# Patient Record
Sex: Male | Born: 1945 | Hispanic: No | Marital: Married | Smoking: Current every day smoker
Health system: Southern US, Community
[De-identification: ages and names within clinical notes are randomized; demographics above are authoritative.]

## PROBLEM LIST (undated history)

## (undated) HISTORY — PX: JOINT REPLACEMENT: SHX530

## (undated) HISTORY — PX: KNEE ARTHROPLASTY: SHX992

---

## 2008-06-27 ENCOUNTER — Inpatient Hospital Stay (HOSPITAL_COMMUNITY): Admission: EM | Admit: 2008-06-27 | Discharge: 2008-06-30 | Payer: Self-pay | Admitting: Emergency Medicine

## 2010-07-21 LAB — DIFFERENTIAL
Basophils Relative: 2 % — ABNORMAL HIGH (ref 0–1)
Eosinophils Absolute: 0 10*3/uL (ref 0.0–0.7)
Eosinophils Relative: 0 % (ref 0–5)
Monocytes Absolute: 0.4 10*3/uL (ref 0.1–1.0)
Monocytes Relative: 5 % (ref 3–12)

## 2010-07-21 LAB — CBC
HCT: 43.3 % (ref 39.0–52.0)
Hemoglobin: 14.9 g/dL (ref 13.0–17.0)
MCHC: 34.3 g/dL (ref 30.0–36.0)
MCV: 91.5 fL (ref 78.0–100.0)
RBC: 4.73 MIL/uL (ref 4.22–5.81)

## 2010-07-21 LAB — BASIC METABOLIC PANEL
CO2: 23 mEq/L (ref 19–32)
Chloride: 104 mEq/L (ref 96–112)
GFR calc Af Amer: >60 mL/min (ref >60)
Glucose, Bld: 109 mg/dL — ABNORMAL HIGH (ref 70–99)
Sodium: 137 mEq/L (ref 135–145)

## 2010-07-21 LAB — PROTIME-INR
INR: 1.3 (ref 0.00–1.49)
Prothrombin Time: 14.9 seconds (ref 11.6–15.2)
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
## 2010-08-23 NOTE — Op Note (Signed)
John Padilla, John Padilla                  ACCOUNT NO.:  [id number]   MEDICAL RECORD NO.:  [id number]          PATIENT TYPE:  INP   LOCATION:  1616                         FACILITY:  [facility]   PHYSICIAN:  Burnard Bunting, M.D.    DATE OF BIRTH:  03-Sep-1945   DATE OF PROCEDURE:  06/27/2008  DATE OF DISCHARGE:                               OPERATIVE REPORT   PREOPERATIVE DIAGNOSIS:  Right distal femur fracture.   POSTOPERATIVE DIAGNOSIS:  Right distal femur fracture.   OPERATION/PROCEDURE:  Right distal femur fracture, retrograde  intramedullary nail inserted.   SURGEON:  Burnard Bunting, M.D.   ASSISTANT:  None.   ANESTHESIA:  Spinal.   INDICATIONS:  Montey Ebel is an ambulatory, 65 year old patient who fell  on his distal right femur and is sent for operative management of the  distal femur fracture after explanation of the risks and benefits.   COMPONENTS:  [employer] 10 x 36 retrograde femoral nail with  distal screws, 5 x 75, 60 and 45 with a 50 x 30 proximal AP screw x1.   DESCRIPTION OF PROCEDURE:  The patient was brought to the operating room  where spinal anesthesia was induced.  IV antibiotics were given  preoperatively.  Time out was called. The patient was placed on the  fracture table with a bump under the right hip and right leg, prepped  with DuraPrep solution and draped in a sterile manner.  Collier Flowers was used  to cover the operative field.  Incision was made from the inferior  corner of the patella through the patella tendon.  Skin and subcutaneous  tissue were sharply divided.  Patella tendon was divided.  At this point  the femur was encountered and a guide pin was placed in the AP and  lateral planes in the central portion of the femur, across the fracture  site. At this time proximal reaming was performed with tissue protector  in place.  Longer guide pin was then placed across the fracture and the  good cortical chatter was achieved with an 11 mm flexible reamer.  A  10  x 36 nail was then placed across the fracture site. Three distal  interlocking screws were placed through separate incisions with  excellent purchase obtained and correct placement and length were  confirmed in the orthogonal planes under fluoroscopy.  A single AP screw  was ;placed in the distal hole of the proximal interlock with good  purchase obtained and good length confirming the AP and lateral planes.  At this time the incision was  irrigated and closed using 0 Vicryl, 2-0 Vicryl sutures and skin  staples.  The other interlock incisions were also irrigated and closed  using 2-0 Vicryl and skin staples.  Bulky dressing was applied.  The  patient tolerated the procedure well without any immediate  complications.      Burnard Bunting, M.D.  Electronically Signed     GSD/MEDQ  D:  06/27/2008  T:  06/28/2008  Job:  [id number]
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
## 2013-02-14 ENCOUNTER — Ambulatory Visit: Payer: Self-pay | Admitting: Family

## 2013-03-21 ENCOUNTER — Ambulatory Visit: Payer: Self-pay | Admitting: Family

## 2019-11-01 ENCOUNTER — Ambulatory Visit: Payer: Self-pay | Attending: Critical Care Medicine

## 2019-11-01 DIAGNOSIS — Z23 Encounter for immunization: Secondary | ICD-10-CM

## 2019-11-01 NOTE — Progress Notes (Signed)
   Covid-19 Vaccination Clinic  Name:  John Padilla    MRN: [id number] DOB: [birth year]  11/01/2019  [name] was observed post Covid-19 immunization for 15 minutes without incident. He was provided with Vaccine Information Sheet and instruction to access the V-Safe system.   [name] was instructed to call 911 with any severe reactions post vaccine: Marland Kitchen Difficulty breathing  . Swelling of face and throat  . A fast heartbeat  . A bad rash all over body  . Dizziness and weakness   Immunizations Administered    Name Date Dose VIS Date Route   Pfizer COVID-19 Vaccine 11/01/2019 10:55 AM 0.3 mL 06/04/2018 Intramuscular   Manufacturer: [employer], [employer]   Lot: [id number]   [place]: [id number]

## 2019-11-22 ENCOUNTER — Ambulatory Visit: Payer: Self-pay

## 2021-09-21 ENCOUNTER — Encounter (HOSPITAL_COMMUNITY): Payer: Self-pay

## 2021-09-21 ENCOUNTER — Emergency Department (HOSPITAL_COMMUNITY): Payer: Medicare Other

## 2021-09-21 ENCOUNTER — Inpatient Hospital Stay (HOSPITAL_COMMUNITY)
Admission: EM | Admit: 2021-09-21 | Discharge: 2021-09-27 | DRG: 956 | Disposition: A | Discharge status: Skilled Nursing Facility | Payer: Medicare Other | Attending: Internal Medicine | Admitting: Internal Medicine

## 2021-09-21 DIAGNOSIS — K760 Fatty (change of) liver, not elsewhere classified: Secondary | ICD-10-CM

## 2021-09-21 DIAGNOSIS — R066 Hiccough: Secondary | ICD-10-CM | POA: Diagnosis not present

## 2021-09-21 DIAGNOSIS — R748 Abnormal levels of other serum enzymes: Secondary | ICD-10-CM

## 2021-09-21 DIAGNOSIS — B182 Chronic viral hepatitis C: Secondary | ICD-10-CM | POA: Diagnosis present

## 2021-09-21 DIAGNOSIS — Z789 Other specified health status: Secondary | ICD-10-CM

## 2021-09-21 DIAGNOSIS — S72141A Displaced intertrochanteric fracture of right femur, initial encounter for closed fracture: Principal | ICD-10-CM

## 2021-09-21 DIAGNOSIS — M6282 Rhabdomyolysis: Secondary | ICD-10-CM

## 2021-09-21 DIAGNOSIS — D689 Coagulation defect, unspecified: Secondary | ICD-10-CM

## 2021-09-21 DIAGNOSIS — Z23 Encounter for immunization: Secondary | ICD-10-CM

## 2021-09-21 DIAGNOSIS — F17210 Nicotine dependence, cigarettes, uncomplicated: Secondary | ICD-10-CM | POA: Diagnosis present

## 2021-09-21 DIAGNOSIS — G929 Unspecified toxic encephalopathy: Secondary | ICD-10-CM

## 2021-09-21 DIAGNOSIS — Z6821 Body mass index (BMI) 21.0-21.9, adult: Secondary | ICD-10-CM

## 2021-09-21 DIAGNOSIS — D696 Thrombocytopenia, unspecified: Secondary | ICD-10-CM

## 2021-09-21 DIAGNOSIS — E871 Hypo-osmolality and hyponatremia: Secondary | ICD-10-CM

## 2021-09-21 DIAGNOSIS — F1010 Alcohol abuse, uncomplicated: Secondary | ICD-10-CM | POA: Diagnosis present

## 2021-09-21 DIAGNOSIS — R160 Hepatomegaly, not elsewhere classified: Secondary | ICD-10-CM

## 2021-09-21 DIAGNOSIS — T796XXA Traumatic ischemia of muscle, initial encounter: Secondary | ICD-10-CM | POA: Diagnosis present

## 2021-09-21 DIAGNOSIS — T424X5A Adverse effect of benzodiazepines, initial encounter: Secondary | ICD-10-CM | POA: Diagnosis not present

## 2021-09-21 DIAGNOSIS — L80 Vitiligo: Secondary | ICD-10-CM

## 2021-09-21 DIAGNOSIS — D649 Anemia, unspecified: Secondary | ICD-10-CM

## 2021-09-21 DIAGNOSIS — D6959 Other secondary thrombocytopenia: Secondary | ICD-10-CM | POA: Diagnosis present

## 2021-09-21 DIAGNOSIS — D638 Anemia in other chronic diseases classified elsewhere: Secondary | ICD-10-CM | POA: Diagnosis present

## 2021-09-21 DIAGNOSIS — E440 Moderate protein-calorie malnutrition: Secondary | ICD-10-CM | POA: Diagnosis present

## 2021-09-21 DIAGNOSIS — W19XXXA Unspecified fall, initial encounter: Secondary | ICD-10-CM | POA: Diagnosis present

## 2021-09-21 DIAGNOSIS — Z20822 Contact with and (suspected) exposure to covid-19: Secondary | ICD-10-CM | POA: Diagnosis present

## 2021-09-21 DIAGNOSIS — F1090 Alcohol use, unspecified, uncomplicated: Secondary | ICD-10-CM

## 2021-09-21 DIAGNOSIS — G928 Other toxic encephalopathy: Secondary | ICD-10-CM | POA: Diagnosis not present

## 2021-09-21 DIAGNOSIS — D62 Acute posthemorrhagic anemia: Secondary | ICD-10-CM

## 2021-09-21 DIAGNOSIS — Y9355 Activity, bike riding: Secondary | ICD-10-CM

## 2021-09-21 LAB — BASIC METABOLIC PANEL
Anion gap: 13 (ref 5–15)
BUN: 14 mg/dL (ref 8–23)
CO2: 19 mmol/L — ABNORMAL LOW (ref 22–32)
Calcium: 8.7 mg/dL — ABNORMAL LOW (ref 8.9–10.3)
Chloride: 105 mmol/L (ref 98–111)
Creatinine, Ser: 1.2 mg/dL (ref 0.61–1.24)
GFR, Estimated: >60 mL/min (ref >60)
Glucose, Bld: 106 mg/dL — ABNORMAL HIGH (ref 70–99)
Potassium: 4 mmol/L (ref 3.5–5.1)
Sodium: 137 mmol/L (ref 135–145)

## 2021-09-21 MED ORDER — SODIUM CHLORIDE 0.9 % IV BOLUS (SEPSIS)
1000.0000 mL | Freq: Once | INTRAVENOUS | Status: AC
  Administered 2021-09-22: 1000 mL via INTRAVENOUS

## 2021-09-21 MED ORDER — FENTANYL CITRATE PF 50 MCG/ML IJ SOSY
50.0000 ug | PREFILLED_SYRINGE | Freq: Once | INTRAMUSCULAR | Status: AC
  Administered 2021-09-22: 50 ug via INTRAVENOUS
  Filled 2021-09-21: qty 1

## 2021-09-21 MED ORDER — TETANUS-DIPHTH-ACELL PERTUSSIS 5-2.5-18.5 LF-MCG/0.5 IM SUSY
0.5000 mL | PREFILLED_SYRINGE | Freq: Once | INTRAMUSCULAR | Status: AC
Start: 2021-09-22 — End: 2021-09-22
  Administered 2021-09-22: 0.5 mL via INTRAMUSCULAR
  Filled 2021-09-21: qty 0.5

## 2021-09-21 NOTE — Progress Notes (Signed)
R intertroch fracture above retrograde femoral nail. Nail removal and ORIF indicated. Please admit to medicine for optimization. Possible OR tomorrow with Dr. Doreatha Martin pending medical clearance and OR availability. Full consult note to follow in the AM.

## 2021-09-21 NOTE — ED Provider Notes (Incomplete)
  [facility] EMERGENCY DEPARTMENT Provider Note   CSN: [id number] Arrival date & time: 09/21/21  2235     History {Add pertinent medical, surgical, social history, OB history to HPI:1} Chief Complaint  Patient presents with  . Fall    John Padilla is a 76 y.o. male.   Fall       Home Medications Prior to Admission medications   Not on File      Allergies    Patient has no known allergies.    Review of Systems   Review of Systems  Physical Exam Updated Vital Signs BP 135/62   Pulse 83   Temp 97.9 F (36.6 C) (Oral)   Resp 18   SpO2 95%  Physical Exam  ED Results / Procedures / Treatments   Labs (all labs ordered are listed, but only abnormal results are displayed) Labs Reviewed  CBC  BASIC METABOLIC PANEL    EKG None  Radiology DG Hip Unilat  With Pelvis 2-3 Views Right  Result Date: 09/21/2021 CLINICAL DATA:  Fall EXAM: DG HIP (WITH OR WITHOUT PELVIS) 2-3V RIGHT COMPARISON:  06/27/2008 FINDINGS: Right femoral intramedullary rod in place. There is a right femoral intertrochanteric fracture just above the intramedullary nail. Varus angulation. No subluxation or dislocation. Hip joints and SI joints symmetric. IMPRESSION: Right femoral intertrochanteric fracture with varus angulation. Fracture is just above the indwelling intramedullary nail. Electronically Signed   By: Rolm Baptise M.D.   On: 09/21/2021 23:06    Procedures Procedures  {Document cardiac monitor, telemetry assessment procedure when appropriate:1}  Medications Ordered in ED Medications - No data to display  ED Course/ Medical Decision Making/ A&P                           Medical Decision Making Amount and/or Complexity of Data Reviewed Radiology: ordered.   ***  {Document critical care time when appropriate:1} {Document review of labs and clinical decision tools ie heart score, Chads2Vasc2 etc:1}  {Document your independent review of radiology images, and any  outside records:1} {Document your discussion with family members, caretakers, and with consultants:1} {Document social determinants of health affecting pt's care:1} {Document your decision making why or why not admission, treatments were needed:1} Final Clinical Impression(s) / ED Diagnoses Final diagnoses:  None    Rx / DC Orders ED Discharge Orders     None
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
## 2021-09-22 ENCOUNTER — Encounter (HOSPITAL_COMMUNITY): Payer: Self-pay | Admitting: Internal Medicine

## 2021-09-22 ENCOUNTER — Inpatient Hospital Stay (HOSPITAL_COMMUNITY): Payer: Medicare Other | Admitting: Anesthesiology

## 2021-09-22 ENCOUNTER — Encounter (HOSPITAL_COMMUNITY)
Admission: EM | Disposition: A | Discharge status: Skilled Nursing Facility | Payer: Self-pay | Source: Home / Self Care | Attending: Student

## 2021-09-22 ENCOUNTER — Inpatient Hospital Stay (HOSPITAL_COMMUNITY): Payer: Medicare Other

## 2021-09-22 ENCOUNTER — Other Ambulatory Visit: Payer: Self-pay

## 2021-09-22 ENCOUNTER — Emergency Department (HOSPITAL_COMMUNITY): Payer: Medicare Other

## 2021-09-22 DIAGNOSIS — M6282 Rhabdomyolysis: Secondary | ICD-10-CM

## 2021-09-22 DIAGNOSIS — Z20822 Contact with and (suspected) exposure to covid-19: Secondary | ICD-10-CM | POA: Diagnosis present

## 2021-09-22 DIAGNOSIS — E871 Hypo-osmolality and hyponatremia: Secondary | ICD-10-CM

## 2021-09-22 DIAGNOSIS — Y9355 Activity, bike riding: Secondary | ICD-10-CM | POA: Diagnosis not present

## 2021-09-22 DIAGNOSIS — D689 Coagulation defect, unspecified: Secondary | ICD-10-CM

## 2021-09-22 DIAGNOSIS — Z23 Encounter for immunization: Secondary | ICD-10-CM | POA: Diagnosis present

## 2021-09-22 DIAGNOSIS — G928 Other toxic encephalopathy: Secondary | ICD-10-CM | POA: Diagnosis not present

## 2021-09-22 DIAGNOSIS — T796XXA Traumatic ischemia of muscle, initial encounter: Secondary | ICD-10-CM | POA: Diagnosis present

## 2021-09-22 DIAGNOSIS — B182 Chronic viral hepatitis C: Secondary | ICD-10-CM | POA: Diagnosis present

## 2021-09-22 DIAGNOSIS — R748 Abnormal levels of other serum enzymes: Secondary | ICD-10-CM

## 2021-09-22 DIAGNOSIS — D638 Anemia in other chronic diseases classified elsewhere: Secondary | ICD-10-CM | POA: Diagnosis present

## 2021-09-22 DIAGNOSIS — L80 Vitiligo: Secondary | ICD-10-CM

## 2021-09-22 DIAGNOSIS — S72141A Displaced intertrochanteric fracture of right femur, initial encounter for closed fracture: Secondary | ICD-10-CM

## 2021-09-22 DIAGNOSIS — K760 Fatty (change of) liver, not elsewhere classified: Secondary | ICD-10-CM

## 2021-09-22 DIAGNOSIS — W19XXXA Unspecified fall, initial encounter: Secondary | ICD-10-CM | POA: Diagnosis present

## 2021-09-22 DIAGNOSIS — D6959 Other secondary thrombocytopenia: Secondary | ICD-10-CM | POA: Diagnosis present

## 2021-09-22 DIAGNOSIS — R066 Hiccough: Secondary | ICD-10-CM | POA: Diagnosis not present

## 2021-09-22 DIAGNOSIS — D62 Acute posthemorrhagic anemia: Secondary | ICD-10-CM | POA: Diagnosis not present

## 2021-09-22 DIAGNOSIS — T424X5A Adverse effect of benzodiazepines, initial encounter: Secondary | ICD-10-CM | POA: Diagnosis not present

## 2021-09-22 DIAGNOSIS — Z6821 Body mass index (BMI) 21.0-21.9, adult: Secondary | ICD-10-CM | POA: Diagnosis not present

## 2021-09-22 DIAGNOSIS — D759 Disease of blood and blood-forming organs, unspecified: Secondary | ICD-10-CM

## 2021-09-22 DIAGNOSIS — D649 Anemia, unspecified: Secondary | ICD-10-CM

## 2021-09-22 DIAGNOSIS — F1010 Alcohol abuse, uncomplicated: Secondary | ICD-10-CM | POA: Diagnosis present

## 2021-09-22 DIAGNOSIS — D696 Thrombocytopenia, unspecified: Secondary | ICD-10-CM | POA: Diagnosis present

## 2021-09-22 DIAGNOSIS — R160 Hepatomegaly, not elsewhere classified: Secondary | ICD-10-CM

## 2021-09-22 DIAGNOSIS — Z789 Other specified health status: Secondary | ICD-10-CM

## 2021-09-22 DIAGNOSIS — M9702XA Periprosthetic fracture around internal prosthetic left hip joint, initial encounter: Secondary | ICD-10-CM

## 2021-09-22 DIAGNOSIS — E440 Moderate protein-calorie malnutrition: Secondary | ICD-10-CM | POA: Diagnosis present

## 2021-09-22 DIAGNOSIS — F17210 Nicotine dependence, cigarettes, uncomplicated: Secondary | ICD-10-CM | POA: Diagnosis present

## 2021-09-22 HISTORY — PX: HARDWARE REMOVAL: SHX979

## 2021-09-22 HISTORY — PX: INTRAMEDULLARY (IM) NAIL INTERTROCHANTERIC: SHX5875

## 2021-09-22 LAB — CBC
HCT: 36 % — ABNORMAL LOW (ref 39.0–52.0)
Hemoglobin: 12.7 g/dL — ABNORMAL LOW (ref 13.0–17.0)
MCH: 33.1 pg (ref 26.0–34.0)
MCHC: 35.3 g/dL (ref 30.0–36.0)
MCV: 93.8 fL (ref 80.0–100.0)
Platelets: 48 10*3/uL — ABNORMAL LOW (ref 150–400)
RBC: 3.84 MIL/uL — ABNORMAL LOW (ref 4.22–5.81)
RDW: 13.9 % (ref 11.5–15.5)
WBC: 5.7 10*3/uL (ref 4.0–10.5)
nRBC: 0 % (ref 0.0–0.2)

## 2021-09-22 LAB — CBC WITH DIFFERENTIAL/PLATELET
Abs Immature Granulocytes: 0.02 10*3/uL (ref 0.00–0.07)
Basophils Absolute: 0 10*3/uL (ref 0.0–0.1)
Basophils Relative: 0 %
Eosinophils Absolute: 0 10*3/uL (ref 0.0–0.5)
Eosinophils Relative: 0 %
HCT: 31.5 % — ABNORMAL LOW (ref 39.0–52.0)
Hemoglobin: 10.9 g/dL — ABNORMAL LOW (ref 13.0–17.0)
Immature Granulocytes: 1 %
Lymphocytes Relative: 29 %
Lymphs Abs: 1.3 10*3/uL (ref 0.7–4.0)
MCH: 33.1 pg (ref 26.0–34.0)
MCHC: 34.6 g/dL (ref 30.0–36.0)
MCV: 95.7 fL (ref 80.0–100.0)
Monocytes Absolute: 0.5 10*3/uL (ref 0.1–1.0)
Monocytes Relative: 11 %
Neutro Abs: 2.6 10*3/uL (ref 1.7–7.7)
Neutrophils Relative %: 59 %
Platelets: 44 10*3/uL — ABNORMAL LOW (ref 150–400)
RBC: 3.29 MIL/uL — ABNORMAL LOW (ref 4.22–5.81)
RDW: 13.9 % (ref 11.5–15.5)
WBC: 4.3 10*3/uL (ref 4.0–10.5)
nRBC: 0 % (ref 0.0–0.2)

## 2021-09-22 LAB — MAGNESIUM: Magnesium: 1.7 mg/dL (ref 1.7–2.4)

## 2021-09-22 LAB — PROTIME-INR
INR: 1.5 — ABNORMAL HIGH (ref 0.8–1.2)
INR: 1.5 — ABNORMAL HIGH (ref 0.8–1.2)
Prothrombin Time: 17.9 seconds — ABNORMAL HIGH (ref 11.4–15.2)
Prothrombin Time: 18.1 seconds — ABNORMAL HIGH (ref 11.4–15.2)

## 2021-09-22 LAB — HEPATIC FUNCTION PANEL
ALT: 77 U/L — ABNORMAL HIGH (ref 0–44)
AST: 92 U/L — ABNORMAL HIGH (ref 15–41)
Albumin: 2.8 g/dL — ABNORMAL LOW (ref 3.5–5.0)
Alkaline Phosphatase: 98 U/L (ref 38–126)
Bilirubin, Direct: 0.7 mg/dL — ABNORMAL HIGH (ref 0.0–0.2)
Indirect Bilirubin: 1.1 mg/dL — ABNORMAL HIGH (ref 0.3–0.9)
Total Bilirubin: 1.8 mg/dL — ABNORMAL HIGH (ref 0.3–1.2)
Total Protein: 6.8 g/dL (ref 6.5–8.1)

## 2021-09-22 LAB — COMPREHENSIVE METABOLIC PANEL
ALT: 69 U/L — ABNORMAL HIGH (ref 0–44)
AST: 74 U/L — ABNORMAL HIGH (ref 15–41)
Albumin: 2.4 g/dL — ABNORMAL LOW (ref 3.5–5.0)
Alkaline Phosphatase: 81 U/L (ref 38–126)
Anion gap: 6 (ref 5–15)
BUN: 12 mg/dL (ref 8–23)
CO2: 19 mmol/L — ABNORMAL LOW (ref 22–32)
Calcium: 7.8 mg/dL — ABNORMAL LOW (ref 8.9–10.3)
Chloride: 108 mmol/L (ref 98–111)
Creatinine, Ser: 1.05 mg/dL (ref 0.61–1.24)
GFR, Estimated: >60 mL/min (ref >60)
Glucose, Bld: 127 mg/dL — ABNORMAL HIGH (ref 70–99)
Potassium: 3.9 mmol/L (ref 3.5–5.1)
Sodium: 133 mmol/L — ABNORMAL LOW (ref 135–145)
Total Bilirubin: 1.8 mg/dL — ABNORMAL HIGH (ref 0.3–1.2)
Total Protein: 6.1 g/dL — ABNORMAL LOW (ref 6.5–8.1)

## 2021-09-22 LAB — HEPATITIS PANEL, ACUTE
HCV Ab: REACTIVE — AB
Hep A IgM: NONREACTIVE
Hep B C IgM: NONREACTIVE
Hepatitis B Surface Ag: NONREACTIVE

## 2021-09-22 LAB — SARS CORONAVIRUS 2 BY RT PCR: SARS Coronavirus 2 by RT PCR: NEGATIVE

## 2021-09-22 LAB — CK: Total CK: 803 U/L — ABNORMAL HIGH (ref 49–397)

## 2021-09-22 LAB — SURGICAL PCR SCREEN
MRSA, PCR: NEGATIVE
Staphylococcus aureus: NEGATIVE

## 2021-09-22 LAB — ABO/RH: ABO/RH(D): O POS

## 2021-09-22 LAB — PHOSPHORUS: Phosphorus: 3.3 mg/dL (ref 2.5–4.6)

## 2021-09-22 SURGERY — FIXATION, FRACTURE, INTERTROCHANTERIC, WITH INTRAMEDULLARY ROD
Anesthesia: General | Laterality: Right

## 2021-09-22 MED ORDER — FOLIC ACID 1 MG PO TABS
1.0000 mg | ORAL_TABLET | Freq: Every day | ORAL | Status: DC
  Administered 2021-09-23 – 2021-09-27 (×4): 1 mg via ORAL
  Filled 2021-09-22 (×4): qty 1

## 2021-09-22 MED ORDER — DOCUSATE SODIUM 100 MG PO CAPS
100.0000 mg | ORAL_CAPSULE | Freq: Two times a day (BID) | ORAL | Status: DC
  Administered 2021-09-22 – 2021-09-27 (×7): 100 mg via ORAL
  Filled 2021-09-22 (×8): qty 1

## 2021-09-22 MED ORDER — TRANEXAMIC ACID-NACL 1000-0.7 MG/100ML-% IV SOLN
1000.0000 mg | Freq: Once | INTRAVENOUS | Status: AC
  Administered 2021-09-22: 1000 mg via INTRAVENOUS
  Filled 2021-09-22: qty 100

## 2021-09-22 MED ORDER — ACETAMINOPHEN 325 MG PO TABS: 650.0000 mg | ORAL_TABLET | Freq: Four times a day (QID) | ORAL | Status: DC | PRN

## 2021-09-22 MED ORDER — LACTATED RINGERS IV SOLN: INTRAVENOUS | Status: DC

## 2021-09-22 MED ORDER — METOCLOPRAMIDE HCL 5 MG PO TABS: 5.0000 mg | ORAL_TABLET | Freq: Three times a day (TID) | ORAL | Status: DC | PRN

## 2021-09-22 MED ORDER — THIAMINE HCL 100 MG PO TABS
100.0000 mg | ORAL_TABLET | Freq: Every day | ORAL | Status: DC
  Administered 2021-09-23 – 2021-09-27 (×4): 100 mg via ORAL
  Filled 2021-09-22 (×4): qty 1

## 2021-09-22 MED ORDER — CHLORHEXIDINE GLUCONATE 0.12 % MT SOLN
OROMUCOSAL | Status: AC
  Administered 2021-09-22: 15 mL via OROMUCOSAL
  Filled 2021-09-22: qty 15

## 2021-09-22 MED ORDER — FENTANYL CITRATE (PF) 250 MCG/5ML IJ SOLN
INTRAMUSCULAR | Status: DC | PRN
  Administered 2021-09-22: 100 ug via INTRAVENOUS
  Administered 2021-09-22: 50 ug via INTRAVENOUS

## 2021-09-22 MED ORDER — THIAMINE HCL 100 MG/ML IJ SOLN
100.0000 mg | Freq: Every day | INTRAMUSCULAR | Status: DC
  Administered 2021-09-24: 100 mg via INTRAVENOUS
  Filled 2021-09-22: qty 2

## 2021-09-22 MED ORDER — FENTANYL CITRATE (PF) 100 MCG/2ML IJ SOLN
INTRAMUSCULAR | Status: AC
  Administered 2021-09-22: 100 ug via INTRAVENOUS
  Filled 2021-09-22: qty 2

## 2021-09-22 MED ORDER — OXYCODONE HCL 5 MG PO TABS: 5.0000 mg | ORAL_TABLET | ORAL | Status: DC | PRN

## 2021-09-22 MED ORDER — MIDAZOLAM HCL 2 MG/2ML IJ SOLN
INTRAMUSCULAR | Status: AC
  Filled 2021-09-22: qty 2

## 2021-09-22 MED ORDER — DEXAMETHASONE SODIUM PHOSPHATE 10 MG/ML IJ SOLN
INTRAMUSCULAR | Status: AC
  Filled 2021-09-22: qty 1

## 2021-09-22 MED ORDER — MORPHINE SULFATE (PF) 2 MG/ML IV SOLN: 0.5000 mg | INTRAVENOUS | Status: DC | PRN

## 2021-09-22 MED ORDER — ONDANSETRON HCL 4 MG PO TABS: 4.0000 mg | ORAL_TABLET | Freq: Four times a day (QID) | ORAL | Status: DC | PRN

## 2021-09-22 MED ORDER — ADULT MULTIVITAMIN W/MINERALS CH
1.0000 | ORAL_TABLET | Freq: Every day | ORAL | Status: DC
  Administered 2021-09-23 – 2021-09-27 (×4): 1 via ORAL
  Filled 2021-09-22 (×4): qty 1

## 2021-09-22 MED ORDER — FENTANYL CITRATE (PF) 250 MCG/5ML IJ SOLN
INTRAMUSCULAR | Status: AC
  Filled 2021-09-22: qty 5

## 2021-09-22 MED ORDER — CEFAZOLIN SODIUM-DEXTROSE 2-4 GM/100ML-% IV SOLN
2.0000 g | Freq: Three times a day (TID) | INTRAVENOUS | Status: AC
  Administered 2021-09-22 – 2021-09-23 (×3): 2 g via INTRAVENOUS
  Filled 2021-09-22 (×3): qty 100

## 2021-09-22 MED ORDER — METHOCARBAMOL 500 MG PO TABS
500.0000 mg | ORAL_TABLET | Freq: Four times a day (QID) | ORAL | Status: DC | PRN
  Administered 2021-09-22 – 2021-09-23 (×3): 500 mg via ORAL
  Filled 2021-09-22 (×3): qty 1

## 2021-09-22 MED ORDER — 0.9 % SODIUM CHLORIDE (POUR BTL) OPTIME
TOPICAL | Status: DC | PRN
  Administered 2021-09-22: 1000 mL

## 2021-09-22 MED ORDER — BUPIVACAINE-EPINEPHRINE (PF) 0.5% -1:200000 IJ SOLN
INTRAMUSCULAR | Status: DC | PRN
  Administered 2021-09-22: 25 mL

## 2021-09-22 MED ORDER — VANCOMYCIN HCL 1000 MG IV SOLR
INTRAVENOUS | Status: AC
Start: 2021-09-22 — End: ?
  Filled 2021-09-22: qty 20

## 2021-09-22 MED ORDER — SUGAMMADEX SODIUM 200 MG/2ML IV SOLN
INTRAVENOUS | Status: DC | PRN
  Administered 2021-09-22: 200 mg via INTRAVENOUS

## 2021-09-22 MED ORDER — LORAZEPAM 1 MG PO TABS: 1.0000 mg | ORAL_TABLET | ORAL | Status: DC | PRN

## 2021-09-22 MED ORDER — OXYCODONE HCL 5 MG PO TABS: 5.0000 mg | ORAL_TABLET | Freq: Once | ORAL | Status: DC | PRN

## 2021-09-22 MED ORDER — METHOCARBAMOL 1000 MG/10ML IJ SOLN: 500.0000 mg | Freq: Four times a day (QID) | INTRAVENOUS | Status: DC | PRN

## 2021-09-22 MED ORDER — ONDANSETRON HCL 4 MG/2ML IJ SOLN
INTRAMUSCULAR | Status: AC
  Filled 2021-09-22: qty 2

## 2021-09-22 MED ORDER — ONDANSETRON HCL 4 MG/2ML IJ SOLN
INTRAMUSCULAR | Status: DC | PRN
  Administered 2021-09-22: 4 mg via INTRAVENOUS

## 2021-09-22 MED ORDER — POLYETHYLENE GLYCOL 3350 17 G PO PACK: 17.0000 g | PACK | Freq: Every day | ORAL | Status: DC | PRN

## 2021-09-22 MED ORDER — MIDAZOLAM HCL 2 MG/2ML IJ SOLN
INTRAMUSCULAR | Status: DC | PRN
  Administered 2021-09-22: 1 mg via INTRAVENOUS

## 2021-09-22 MED ORDER — ORAL CARE MOUTH RINSE: 15.0000 mL | Freq: Once | OROMUCOSAL | Status: AC

## 2021-09-22 MED ORDER — AMISULPRIDE (ANTIEMETIC) 5 MG/2ML IV SOLN: 10.0000 mg | Freq: Once | INTRAVENOUS | Status: DC | PRN

## 2021-09-22 MED ORDER — HYDROMORPHONE HCL 1 MG/ML IJ SOLN
0.5000 mg | INTRAMUSCULAR | Status: DC | PRN
  Administered 2021-09-22: 0.5 mg via INTRAVENOUS
  Filled 2021-09-22: qty 1

## 2021-09-22 MED ORDER — HYDROMORPHONE HCL 1 MG/ML IJ SOLN: 0.2500 mg | INTRAMUSCULAR | Status: DC | PRN

## 2021-09-22 MED ORDER — CHLORHEXIDINE GLUCONATE 0.12 % MT SOLN: 15.0000 mL | Freq: Once | OROMUCOSAL | Status: AC

## 2021-09-22 MED ORDER — DEXAMETHASONE SODIUM PHOSPHATE 10 MG/ML IJ SOLN
INTRAMUSCULAR | Status: DC | PRN
  Administered 2021-09-22: 10 mg via INTRAVENOUS

## 2021-09-22 MED ORDER — PHENYLEPHRINE HCL-NACL 20-0.9 MG/250ML-% IV SOLN
INTRAVENOUS | Status: DC | PRN
  Administered 2021-09-22: 40 ug/min via INTRAVENOUS

## 2021-09-22 MED ORDER — SODIUM CHLORIDE 0.9 % IV SOLN: INTRAVENOUS | Status: DC | PRN

## 2021-09-22 MED ORDER — ROCURONIUM BROMIDE 10 MG/ML (PF) SYRINGE
PREFILLED_SYRINGE | INTRAVENOUS | Status: DC | PRN
  Administered 2021-09-22: 10 mg via INTRAVENOUS
  Administered 2021-09-22: 50 mg via INTRAVENOUS

## 2021-09-22 MED ORDER — OXYCODONE HCL 5 MG/5ML PO SOLN: 5.0000 mg | Freq: Once | ORAL | Status: DC | PRN

## 2021-09-22 MED ORDER — CEFAZOLIN SODIUM-DEXTROSE 2-3 GM-%(50ML) IV SOLR
INTRAVENOUS | Status: DC | PRN
  Administered 2021-09-22: 2 g via INTRAVENOUS

## 2021-09-22 MED ORDER — MELATONIN 5 MG PO TABS
5.0000 mg | ORAL_TABLET | Freq: Every evening | ORAL | Status: DC | PRN
Start: 2021-09-22 — End: 2021-09-27
  Administered 2021-09-22 – 2021-09-25 (×4): 5 mg via ORAL
  Filled 2021-09-22 (×4): qty 1

## 2021-09-22 MED ORDER — PROPOFOL 10 MG/ML IV BOLUS
INTRAVENOUS | Status: DC | PRN
  Administered 2021-09-22: 70 mg via INTRAVENOUS

## 2021-09-22 MED ORDER — CEFAZOLIN SODIUM-DEXTROSE 2-4 GM/100ML-% IV SOLN
INTRAVENOUS | Status: AC
  Filled 2021-09-22: qty 100

## 2021-09-22 MED ORDER — FENTANYL CITRATE (PF) 100 MCG/2ML IJ SOLN: 100.0000 ug | Freq: Once | INTRAMUSCULAR | Status: AC

## 2021-09-22 MED ORDER — ROCURONIUM BROMIDE 10 MG/ML (PF) SYRINGE
PREFILLED_SYRINGE | INTRAVENOUS | Status: AC
  Filled 2021-09-22: qty 10

## 2021-09-22 MED ORDER — POTASSIUM CHLORIDE IN NACL 20-0.9 MEQ/L-% IV SOLN
INTRAVENOUS | Status: DC
  Filled 2021-09-22 (×2): qty 1000

## 2021-09-22 MED ORDER — ONDANSETRON HCL 4 MG/2ML IJ SOLN: 4.0000 mg | Freq: Four times a day (QID) | INTRAMUSCULAR | Status: DC | PRN

## 2021-09-22 MED ORDER — ACETAMINOPHEN 325 MG PO TABS
325.0000 mg | ORAL_TABLET | Freq: Four times a day (QID) | ORAL | Status: DC | PRN
  Administered 2021-09-23: 650 mg via ORAL
  Filled 2021-09-22: qty 2

## 2021-09-22 MED ORDER — HYDROCODONE-ACETAMINOPHEN 5-325 MG PO TABS
1.0000 | ORAL_TABLET | ORAL | Status: DC | PRN
  Administered 2021-09-22: 2 via ORAL
  Administered 2021-09-23 (×2): 1 via ORAL
  Filled 2021-09-22 (×2): qty 2
  Filled 2021-09-22: qty 1

## 2021-09-22 MED ORDER — ONDANSETRON HCL 4 MG/2ML IJ SOLN: 4.0000 mg | Freq: Once | INTRAMUSCULAR | Status: DC | PRN

## 2021-09-22 MED ORDER — METOCLOPRAMIDE HCL 5 MG/ML IJ SOLN: 5.0000 mg | Freq: Three times a day (TID) | INTRAMUSCULAR | Status: DC | PRN

## 2021-09-22 MED ORDER — VANCOMYCIN HCL 1000 MG IV SOLR
INTRAVENOUS | Status: DC | PRN
  Administered 2021-09-22: 1000 mg

## 2021-09-22 MED ORDER — LORAZEPAM 2 MG/ML IJ SOLN
1.0000 mg | INTRAMUSCULAR | Status: DC | PRN
  Administered 2021-09-23: 2 mg via INTRAVENOUS
  Filled 2021-09-22: qty 1

## 2021-09-22 MED ORDER — HYDROCODONE-ACETAMINOPHEN 7.5-325 MG PO TABS
1.0000 | ORAL_TABLET | ORAL | Status: DC | PRN
  Administered 2021-09-23: 2 via ORAL
  Filled 2021-09-22: qty 2

## 2021-09-22 MED ORDER — LIDOCAINE 2% (20 MG/ML) 5 ML SYRINGE
INTRAMUSCULAR | Status: AC
  Filled 2021-09-22: qty 5

## 2021-09-22 MED ORDER — PHYTONADIONE 5 MG PO TABS
5.0000 mg | ORAL_TABLET | Freq: Once | ORAL | Status: AC
Start: 2021-09-22 — End: 2021-09-22
  Administered 2021-09-22: 5 mg via ORAL
  Filled 2021-09-22: qty 1

## 2021-09-22 SURGICAL SUPPLY — 71 items
BAG COUNTER SPONGE SURGICOUNT (BAG) ×2 IMPLANT
BIT DRILL INTERTAN LAG SCREW (BIT) ×1 IMPLANT
BIT DRILL LONG 4.0 (BIT) IMPLANT
BNDG ELASTIC 4X5.8 VLCR STR LF (GAUZE/BANDAGES/DRESSINGS) ×2 IMPLANT
BNDG ELASTIC 6X5.8 VLCR STR LF (GAUZE/BANDAGES/DRESSINGS) ×2 IMPLANT
BRUSH SCRUB EZ PLAIN DRY (MISCELLANEOUS) ×4 IMPLANT
CHLORAPREP W/TINT 26 (MISCELLANEOUS) ×3 IMPLANT
COVER SURGICAL LIGHT HANDLE (MISCELLANEOUS) ×4 IMPLANT
CUFF TOURN SGL QUICK 24 (TOURNIQUET CUFF)
CUFF TRNQT CYL 24X4X16.5-23 (TOURNIQUET CUFF) IMPLANT
DERMABOND ADVANCED (GAUZE/BANDAGES/DRESSINGS) ×2
DERMABOND ADVANCED .7 DNX12 (GAUZE/BANDAGES/DRESSINGS) IMPLANT
DRAPE C-ARM 35X43 STRL (DRAPES) ×2 IMPLANT
DRAPE C-ARM 42X72 X-RAY (DRAPES) IMPLANT
DRAPE C-ARMOR (DRAPES) ×2 IMPLANT
DRAPE INCISE IOBAN 66X45 STRL (DRAPES) ×2 IMPLANT
DRAPE STERI IOBAN 125X83 (DRAPES) ×2 IMPLANT
DRAPE SURG 17X23 STRL (DRAPES) ×4 IMPLANT
DRAPE U-SHAPE 47X51 STRL (DRAPES) ×2 IMPLANT
DRESSING MEPILEX FLEX 4X4 (GAUZE/BANDAGES/DRESSINGS) IMPLANT
DRILL BIT LONG 4.0 (BIT) ×2
DRSG MEPILEX BORDER 4X8 (GAUZE/BANDAGES/DRESSINGS) ×1 IMPLANT
DRSG MEPILEX FLEX 4X4 (GAUZE/BANDAGES/DRESSINGS) ×10
ELECT REM PT RETURN 9FT ADLT (ELECTROSURGICAL) ×2
ELECTRODE REM PT RTRN 9FT ADLT (ELECTROSURGICAL) ×1 IMPLANT
GLOVE BIO SURGEON STRL SZ 6.5 (GLOVE) ×6 IMPLANT
GLOVE BIO SURGEON STRL SZ7.5 (GLOVE) ×8 IMPLANT
GLOVE BIOGEL PI IND STRL 6.5 (GLOVE) ×1 IMPLANT
GLOVE BIOGEL PI IND STRL 7.5 (GLOVE) ×1 IMPLANT
GLOVE BIOGEL PI INDICATOR 6.5 (GLOVE) ×1
GLOVE BIOGEL PI INDICATOR 7.5 (GLOVE) ×1
GOWN STRL REUS W/ TWL LRG LVL3 (GOWN DISPOSABLE) ×2 IMPLANT
GOWN STRL REUS W/TWL LRG LVL3 (GOWN DISPOSABLE) ×4
GUIDE PIN 3.2X343 (PIN) ×3
GUIDE PIN 3.2X343MM (PIN) ×6
KIT BASIN OR (CUSTOM PROCEDURE TRAY) ×2 IMPLANT
KIT TURNOVER KIT B (KITS) ×2 IMPLANT
MANIFOLD NEPTUNE II (INSTRUMENTS) ×2 IMPLANT
NAIL EXTRACTOR DISP (INSTRUMENTS) ×1 IMPLANT
NAIL INTERTAN 10X18 130D 10S (Nail) ×1 IMPLANT
NEEDLE 22X1 1/2 (OR ONLY) (NEEDLE) IMPLANT
NS IRRIG 1000ML POUR BTL (IV SOLUTION) ×2 IMPLANT
PACK GENERAL/GYN (CUSTOM PROCEDURE TRAY) ×2 IMPLANT
PACK ORTHO EXTREMITY (CUSTOM PROCEDURE TRAY) ×2 IMPLANT
PAD ARMBOARD 7.5X6 YLW CONV (MISCELLANEOUS) ×4 IMPLANT
PAD CAST 4YDX4 CTTN HI CHSV (CAST SUPPLIES) IMPLANT
PADDING CAST COTTON 4X4 STRL (CAST SUPPLIES) ×2
PADDING CAST COTTON 6X4 STRL (CAST SUPPLIES) ×4 IMPLANT
PIN GUIDE 3.2X343MM (PIN) IMPLANT
SCREW LAG COMPR KIT 95/90 (Screw) ×1 IMPLANT
SCREW TRIGEN LOW PROF 5.0X35 (Screw) ×1 IMPLANT
SPONGE T-LAP 18X18 ~~LOC~~+RFID (SPONGE) ×2 IMPLANT
STAPLER VISISTAT 35W (STAPLE) IMPLANT
STOCKINETTE IMPERVIOUS LG (DRAPES) ×2 IMPLANT
STRIP CLOSURE SKIN 1/2X4 (GAUZE/BANDAGES/DRESSINGS) IMPLANT
SUCTION FRAZIER HANDLE 10FR (MISCELLANEOUS)
SUCTION TUBE FRAZIER 10FR DISP (MISCELLANEOUS) IMPLANT
SUT ETHILON 3 0 PS 1 (SUTURE) IMPLANT
SUT MNCRL AB 3-0 PS2 18 (SUTURE) ×2 IMPLANT
SUT MON AB 2-0 CT1 36 (SUTURE) ×2 IMPLANT
SUT PDS AB 2-0 CT1 27 (SUTURE) IMPLANT
SUT VIC AB 0 CT1 27 (SUTURE)
SUT VIC AB 0 CT1 27XBRD ANBCTR (SUTURE) IMPLANT
SUT VIC AB 2-0 CT1 27 (SUTURE) ×4
SUT VIC AB 2-0 CT1 TAPERPNT 27 (SUTURE) ×2 IMPLANT
SYR CONTROL 10ML LL (SYRINGE) IMPLANT
TOWEL GREEN STERILE (TOWEL DISPOSABLE) ×4 IMPLANT
TOWEL GREEN STERILE FF (TOWEL DISPOSABLE) ×4 IMPLANT
TUBE CONNECTING 12X1/4 (SUCTIONS) ×2 IMPLANT
UNDERPAD 30X36 HEAVY ABSORB (UNDERPADS AND DIAPERS) ×2 IMPLANT
YANKAUER SUCT BULB TIP NO VENT (SUCTIONS) ×2 IMPLANT

## 2021-09-22 NOTE — Anesthesia Preprocedure Evaluation (Addendum)
Anesthesia Evaluation  Patient identified by MRN, date of birth, ID band Patient awake    Reviewed: Allergy & Precautions, NPO status , Patient's Chart, lab work & pertinent test results  History of Anesthesia Complications Negative for: history of anesthetic complications  Airway Mallampati: I  TM Distance: >3 FB Neck ROM: Full    Dental  (+) Edentulous Upper, Edentulous Lower   Pulmonary Current Smoker and Patient abstained from smoking.,    breath sounds clear to auscultation       Cardiovascular (-) anginanegative cardio ROS   Rhythm:Regular Rate:Normal     Neuro/Psych negative neurological ROS  negative psych ROS   GI/Hepatic negative GI ROS, (+)     substance abuse  alcohol use, States he only has 1 can of beer per day, but plt 44 and INR 1.5, elevated LFTs   Endo/Other  negative endocrine ROS  Renal/GU negative Renal ROS  negative genitourinary   Musculoskeletal IM nail    Abdominal   Peds  Hematology  (+) Blood dyscrasia, , Hb 10.5, plt 44 INR 1.5   Anesthesia Other Findings   Reproductive/Obstetrics negative OB ROS                           Anesthesia Physical Anesthesia Plan  ASA: 3  Anesthesia Plan: General   Post-op Pain Management: Regional block*   Induction: Intravenous  PONV Risk Score and Plan: 1 and Ondansetron and Dexamethasone  Airway Management Planned: Oral ETT  Additional Equipment: None  Intra-op Plan:   Post-operative Plan: Extubation in OR  Informed Consent: I have reviewed the patients History and Physical, chart, labs and discussed the procedure including the risks, benefits and alternatives for the proposed anesthesia with the patient or authorized representative who has indicated his/her understanding and acceptance.     Dental advisory given and Interpreter used for interveiw  Plan Discussed with: CRNA and Surgeon  Anesthesia Plan  Comments: (Plan routine monitors, GETA with fascia iliaca/fem nerve block for post op analgesia)      Anesthesia Quick Evaluation

## 2021-09-22 NOTE — Interval H&P Note (Signed)
History and Physical Interval Note:  09/22/2021 11:33 AM  John Padilla  has presented today for surgery, with the diagnosis of Right intertrochanteric femur fracture.  The various methods of treatment have been discussed with the patient and family. After consideration of risks, benefits and other options for treatment, the patient has consented to  Procedure(s): INTRAMEDULLARY (IM) NAIL INTERTROCHANTRIC (Right) HARDWARE REMOVAL FEMUR (Right) as a surgical intervention.  The patient's history has been reviewed, patient examined, no change in status, stable for surgery.  I have reviewed the patient's chart and labs.  Questions were answered to the patient's satisfaction.     Lennette Bihari P [name]

## 2021-09-22 NOTE — Transfer of Care (Signed)
Immediate Anesthesia Transfer of Care Note  Patient: John Padilla  Procedure(s) Performed: INTRAMEDULLARY (IM) NAIL INTERTROCHANTRIC (Right) HARDWARE REMOVAL FEMUR (Right)  Patient Location: PACU  Anesthesia Type:General  Level of Consciousness: awake, alert  and oriented  Airway & Oxygen Therapy: Patient connected to face mask oxygen  Post-op Assessment: Post -op Vital signs reviewed and stable  Post vital signs: stable  Last Vitals:  Vitals Value Taken Time  BP 117/60 09/22/21 1418  Temp    Pulse 64 09/22/21 1420  Resp 15 09/22/21 1420  SpO2 93 % 09/22/21 1420  Vitals shown include unvalidated device data.  Last Pain:  Vitals:   09/22/21 1205  TempSrc:   PainSc: 0-No pain      Patients Stated Pain Goal: 2 (16/58/00 6349)  Complications: No notable events documented.

## 2021-09-22 NOTE — TOC CAGE-AID Note (Signed)
Transition of Care [facility]) - CAGE-AID Screening   Patient Details  Name: Nina Hoar MRN: [id number] Date of Birth: Sep 02, 1945  Transition of Care [facility]) CM/SW Contact:    Gaetano Hawthorne Tarpley-Carter, [place] Phone Number: 09/22/2021, 8:08 AM   Clinical Narrative: Pt participated in [place].  Pt stated he does drink ETOH.  Pt was offered resources, due to no usage of ETOH.    Tajanay Hurley Tarpley-Carter, MSW, LCSW-A Pronouns:  She/Her/Hers [facility]itions of Care Clinical Social Worker Direct Number:  [phone] [email or web address]   CAGE-AID Screening:    Have You Ever Felt You Ought to Cut Down on Your Drinking or Drug Use?: No Have People Annoyed You By [employer] Your Drinking Or Drug Use?: No Have You Felt Bad Or Guilty About Your Drinking Or Drug Use?: No Have You Ever Had a Drink or Used Drugs First Thing In The Morning to Steady Your Nerves or to Get Rid of a Hangover?: No CAGE-AID Score: 0  Substance Abuse Education Offered: Yes  Substance abuse interventions: Scientist, clinical (histocompatibility and immunogenetics)

## 2021-09-22 NOTE — Anesthesia Procedure Notes (Signed)
Anesthesia Regional Block: Fascia iliaca block   Pre-Anesthetic Checklist: , timeout performed,  Correct Patient, Correct Site, Correct Laterality,  Correct Procedure, Correct Position, site marked,  Risks and benefits discussed,  Surgical consent,  Pre-op evaluation,  At surgeon's request and post-op pain management  Laterality: Right and Lower  Prep: chloraprep       Needles:  Injection technique: Single-shot  Needle Type: Echogenic Needle     Needle Length: 9cm  Needle Gauge: 21     Additional Needles:   Procedures:,,,, ultrasound used (permanent image in chart),,    Narrative:  Start time: 09/22/2021 11:46 AM End time: 09/22/2021 11:52 AM Injection made incrementally with aspirations every 5 mL.  Performed by: Personally  Anesthesiologist: Annye Asa, MD  Additional Notes: Pt identified in Holding room.  Monitors applied. Working IV access confirmed. Sterile prep R inguinal crease.  #21ga ECHOgenic Arrow block needle to fascia iliaca under fem nerve with US guidance.  25cc 0.5% Bupivacaine 1:200k epi injected incrementally after negative test dose.  Patient asymptomatic, VSS, no heme aspirated, tolerated well.   Jenita Seashore, MD

## 2021-09-22 NOTE — Anesthesia Postprocedure Evaluation (Signed)
Anesthesia Post Note  Patient: John Padilla  Procedure(s) Performed: INTRAMEDULLARY (IM) NAIL INTERTROCHANTRIC (Right) HARDWARE REMOVAL FEMUR (Right)     Patient location during evaluation: PACU Anesthesia Type: General and Regional Level of consciousness: awake and alert Pain management: pain level controlled Vital Signs Assessment: post-procedure vital signs reviewed and stable Respiratory status: spontaneous breathing, nonlabored ventilation, respiratory function stable and patient connected to nasal cannula oxygen Cardiovascular status: blood pressure returned to baseline and stable Postop Assessment: no apparent nausea or vomiting Anesthetic complications: no   No notable events documented.  Last Vitals:  Vitals:   09/22/21 1450 09/22/21 1505  BP: 124/71 123/69  Pulse: 74 67  Resp: 15 15  Temp:    SpO2: 95% 98%    Last Pain:  Vitals:   09/22/21 1450  TempSrc:   PainSc: 0-No pain                 Mckinzey Entwistle,W. EDMOND

## 2021-09-22 NOTE — Progress Notes (Signed)
Orthopedic Tech Progress Note Patient Details:  John Padilla Jul 30, 1945 [id number]  Patient ID: John Padilla, male   DOB: 07-23-1945, 76 y.o.   MRN: [id number] No OHF; over age limit.  Vernona Rieger 09/22/2021, 3:50 PM
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
## 2021-09-22 NOTE — Anesthesia Procedure Notes (Addendum)
Procedure Name: Intubation Date/Time: 09/22/2021 12:20 PM  Performed by: Lavell Luster, CRNAPre-anesthesia Checklist: Patient identified, Emergency Drugs available, Suction available, Patient being monitored and Timeout performed Patient Re-evaluated:Patient Re-evaluated prior to induction Oxygen Delivery Method: Circle system utilized Preoxygenation: Pre-oxygenation with 100% oxygen Induction Type: IV induction Ventilation: Mask ventilation without difficulty Laryngoscope Size: Mac and 4 Grade View: Grade I Tube type: Oral Tube size: 7.5 mm Number of attempts: 1 Airway Equipment and Method: Stylet Placement Confirmation: positive ETCO2, breath sounds checked- equal and bilateral and ETT inserted through vocal cords under direct vision Secured at: 21 cm Tube secured with: Tape Dental Injury: Teeth and Oropharynx as per pre-operative assessment
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
## 2021-09-22 NOTE — Plan of Care (Signed)

## 2021-09-22 NOTE — Progress Notes (Signed)
When I was waiting for 5N RN to check patient into room, pt asked me to "come here". I approached patient's bedside and patient grasped my hand. Patient then said what sounded like "touch pu**y". I asked patient to repeat himself and he said the same statement and began tugging my hand towards him. I removed my hand and waited at room doorway. I explained what had happened to the nurses and that it could have been a misunderstanding, but that I was concerned for their safety and recommended they not attend to patient alone. They agreed that was for the best.
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
## 2021-09-23 ENCOUNTER — Inpatient Hospital Stay (HOSPITAL_COMMUNITY): Payer: Medicare Other

## 2021-09-23 ENCOUNTER — Other Ambulatory Visit (HOSPITAL_COMMUNITY): Payer: Self-pay

## 2021-09-23 ENCOUNTER — Encounter (HOSPITAL_COMMUNITY): Payer: Self-pay | Admitting: Internal Medicine

## 2021-09-23 DIAGNOSIS — R768 Other specified abnormal immunological findings in serum: Secondary | ICD-10-CM

## 2021-09-23 DIAGNOSIS — B1920 Unspecified viral hepatitis C without hepatic coma: Secondary | ICD-10-CM

## 2021-09-23 DIAGNOSIS — C220 Liver cell carcinoma: Secondary | ICD-10-CM

## 2021-09-23 LAB — COMPREHENSIVE METABOLIC PANEL
ALT: 52 U/L — ABNORMAL HIGH (ref 0–44)
AST: 64 U/L — ABNORMAL HIGH (ref 15–41)
Albumin: 2.1 g/dL — ABNORMAL LOW (ref 3.5–5.0)
Alkaline Phosphatase: 56 U/L (ref 38–126)
Anion gap: 4 — ABNORMAL LOW (ref 5–15)
BUN: 17 mg/dL (ref 8–23)
CO2: 20 mmol/L — ABNORMAL LOW (ref 22–32)
Calcium: 7.5 mg/dL — ABNORMAL LOW (ref 8.9–10.3)
Chloride: 112 mmol/L — ABNORMAL HIGH (ref 98–111)
Creatinine, Ser: 1.23 mg/dL (ref 0.61–1.24)
GFR, Estimated: >60 mL/min (ref >60)
Glucose, Bld: 151 mg/dL — ABNORMAL HIGH (ref 70–99)
Potassium: 4.6 mmol/L (ref 3.5–5.1)
Sodium: 136 mmol/L (ref 135–145)
Total Bilirubin: 1.6 mg/dL — ABNORMAL HIGH (ref 0.3–1.2)
Total Protein: 5.3 g/dL — ABNORMAL LOW (ref 6.5–8.1)

## 2021-09-23 LAB — AFP TUMOR MARKER: AFP, Serum, Tumor Marker: 2098 ng/mL — ABNORMAL HIGH (ref 0.0–8.4)

## 2021-09-23 LAB — VITAMIN D 25 HYDROXY (VIT D DEFICIENCY, FRACTURES): Vit D, 25-Hydroxy: 23.45 ng/mL — ABNORMAL LOW (ref 30–100)

## 2021-09-23 LAB — CBC
HCT: 24.1 % — ABNORMAL LOW (ref 39.0–52.0)
Hemoglobin: 8.3 g/dL — ABNORMAL LOW (ref 13.0–17.0)
MCH: 33.3 pg (ref 26.0–34.0)
MCHC: 34.4 g/dL (ref 30.0–36.0)
MCV: 96.8 fL (ref 80.0–100.0)
Platelets: 50 10*3/uL — ABNORMAL LOW (ref 150–400)
RBC: 2.49 MIL/uL — ABNORMAL LOW (ref 4.22–5.81)
RDW: 14.1 % (ref 11.5–15.5)
WBC: 6.8 10*3/uL (ref 4.0–10.5)
nRBC: 0 % (ref 0.0–0.2)

## 2021-09-23 LAB — CK: Total CK: 1559 U/L — ABNORMAL HIGH (ref 49–397)

## 2021-09-23 LAB — PHOSPHORUS: Phosphorus: 3.7 mg/dL (ref 2.5–4.6)

## 2021-09-23 LAB — AMMONIA: Ammonia: 20 umol/L (ref 9–35)

## 2021-09-23 LAB — PROTIME-INR
INR: 1.4 — ABNORMAL HIGH (ref 0.8–1.2)
Prothrombin Time: 17 seconds — ABNORMAL HIGH (ref 11.4–15.2)

## 2021-09-23 LAB — MAGNESIUM: Magnesium: 1.8 mg/dL (ref 1.7–2.4)

## 2021-09-23 MED ORDER — IOHEXOL 300 MG/ML  SOLN
80.0000 mL | Freq: Once | INTRAMUSCULAR | Status: AC | PRN
Start: 2021-09-23 — End: 2021-09-23
  Administered 2021-09-23: 80 mL via INTRAVENOUS

## 2021-09-23 MED ORDER — GADOBUTROL 1 MMOL/ML IV SOLN
5.0000 mL | Freq: Once | INTRAVENOUS | Status: AC | PRN
  Administered 2021-09-23: 5 mL via INTRAVENOUS

## 2021-09-23 MED ORDER — SODIUM CHLORIDE 0.9 % IV SOLN: INTRAVENOUS | Status: DC

## 2021-09-23 NOTE — Progress Notes (Signed)
Orthopaedic Trauma Progress Note  SUBJECTIVE: Doing okay this morning.  Pain well controlled on current regimen.  No chest pain. No SOB. No nausea/vomiting. No other complaints.  Has not been up out of bed yet since surgery.  Daughter at bedside.  OBJECTIVE:  Vitals:   09/22/21 2100 09/23/21 0228  BP: (!) 143/74 (!) 150/69  Pulse: 76 78  Resp: 19 18  Temp: 98 F (36.7 C) 98.1 F (36.7 C)  SpO2: 100% 100%    General: Sitting up in bed, no acute distress Respiratory: No increased work of breathing.  RLE: Dressing clean, dry, intact.  Tenderness over the knee and throughout the thigh as expected.  Tolerates gentle ankle range of motion.  Able to wiggle toes.  Foot warm and well perfused.  IMAGING: Stable post op imaging.   LABS:  Results for orders placed or performed during the hospital encounter of 09/21/21 (from the past 24 hour(s))  Surgical pcr screen     Status: None   Collection Time: 09/22/21 10:39 AM   Specimen: Nasal Mucosa; Nasal Swab  Result Value Ref Range   MRSA, PCR NEGATIVE NEGATIVE   Staphylococcus aureus NEGATIVE NEGATIVE  CBC     Status: Abnormal   Collection Time: 09/23/21  1:33 AM  Result Value Ref Range   WBC 6.8 4.0 - 10.5 K/uL   RBC 2.49 (L) 4.22 - 5.81 MIL/uL   Hemoglobin 8.3 (L) 13.0 - 17.0 g/dL   HCT 24.1 (L) 39.0 - 52.0 %   MCV 96.8 80.0 - 100.0 fL   MCH 33.3 26.0 - 34.0 pg   MCHC 34.4 30.0 - 36.0 g/dL   RDW 14.1 11.5 - 15.5 %   Platelets 50 (L) 150 - 400 K/uL   nRBC 0.0 0.0 - 0.2 %  Ammonia     Status: None   Collection Time: 09/23/21  1:33 AM  Result Value Ref Range   Ammonia 20 9 - 35 umol/L  Protime-INR     Status: Abnormal   Collection Time: 09/23/21  1:33 AM  Result Value Ref Range   Prothrombin Time 17.0 (H) 11.4 - 15.2 seconds   INR 1.4 (H) 0.8 - 1.2  Magnesium     Status: None   Collection Time: 09/23/21  1:33 AM  Result Value Ref Range   Magnesium 1.8 1.7 - 2.4 mg/dL  Phosphorus     Status: None   Collection Time: 09/23/21   1:33 AM  Result Value Ref Range   Phosphorus 3.7 2.5 - 4.6 mg/dL  Comprehensive metabolic panel     Status: Abnormal   Collection Time: 09/23/21  1:33 AM  Result Value Ref Range   Sodium 136 135 - 145 mmol/L   Potassium 4.6 3.5 - 5.1 mmol/L   Chloride 112 (H) 98 - 111 mmol/L   CO2 20 (L) 22 - 32 mmol/L   Glucose, Bld 151 (H) 70 - 99 mg/dL   BUN 17 8 - 23 mg/dL   Creatinine, Ser 1.23 0.61 - 1.24 mg/dL   Calcium 7.5 (L) 8.9 - 10.3 mg/dL   Total Protein 5.3 (L) 6.5 - 8.1 g/dL   Albumin 2.1 (L) 3.5 - 5.0 g/dL   AST 64 (H) 15 - 41 U/L   ALT 52 (H) 0 - 44 U/L   Alkaline Phosphatase 56 38 - 126 U/L   Total Bilirubin 1.6 (H) 0.3 - 1.2 mg/dL   GFR, Estimated >60 >60 mL/min   Anion gap 4 (L) 5 - 15  ASSESSMENT: John Padilla is a 76 y.o. male, 1 Day Post-Op s/p INTRAMEDULLARY NAIL RIGHT INTERTROCHANTERIC FEMUR FRACTURE  HARDWARE REMOVAL RIGHT FEMUR  CV/Blood loss: Acute blood loss anemia, Hgb 8.3 this morning. Hemodynamically stable  PLAN: Weightbearing: WBAT RLE ROM: Okay for unrestricted hip and knee motion as tolerated Incisional and dressing care: OK to remove dressings 09/24/2021 and leave open to air with dry gauze PRN  Showering: Okay to begin showering getting incisions wet 09/25/2021 Orthopedic device(s): None  Pain management:  1. Tylenol 325-650 mg q 6 hours PRN 2. Norco 5-325 mg q 4 hours PRN moderate pain 3. Norco 7.5-325 mg q 4 hours PRN severe pain 4.  Morphine 0.5-1 mg q 2 hours PRN 5.  Robaxin 500 mg q 6 hours PRN VTE prophylaxis: Lovenox, SCDs ID:  Ancef 2gm post op Foley/Lines:  No foley, KVO IVFs Impediments to Fracture Healing: Vitamin D level pending, will start supplementation as indicated Dispo: PT/OT evaluation today, dispo pending.  Plan to remove/change RLE dressing tomorrow.    D/C recommendations: - Norco 7.5-325 and Robaxin for pain control - Eliquis 2.5 mg BID x 30 daysfor DVT prophylaxis - Possible need for Vit D supplementation  Follow - up  plan: 2 weeks after discharge for wound check and repeat x-rays   Contact information:  Katha Hamming MD, Rushie Nyhan PA-C. After hours and holidays please check [email or web address] for group call information for [facility]   Gwinda Passe, PA-C [phone] (office) [email or web address]
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
## 2021-09-23 NOTE — Consult Note (Addendum)
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 Fracture is just above the indwelling intramedullary nail. Electronically Signed   By: Rolm Baptise M.D.   On: 09/21/2021 23:06     CT CHEST ABDOMEN PELVIS W CONTRAST  Addendum Date: 09/23/2021   ADDENDUM REPORT: 09/23/2021 16:26 ADDENDUM: Signs of pericholecystic edema which could be seen in the setting of liver disease though would be difficult to exclude acalculous cholecystitis as mentioned previously. Correlate clinically and with HIDA scan for added specificity as warranted. These results will be called to the ordering clinician or representative by the Radiologist Assistant, and communication documented in the PACS or [employer]. Electronically Signed   By: Zetta Bills M.D.   On: 09/23/2021 16:26   Result Date: 09/23/2021 CLINICAL DATA:  W 76 year old male with history of liver mass and hip fracture. * Tracking Code: BO * EXAM: CT CHEST, ABDOMEN, AND PELVIS WITH CONTRAST TECHNIQUE: Multidetector CT imaging of the chest, abdomen and pelvis was performed following the standard protocol during bolus administration of intravenous contrast. RADIATION DOSE REDUCTION: This exam was performed according to the departmental dose-optimization  program which includes automated exposure control, adjustment of the mA and/or kV according to patient size and/or use of iterative reconstruction technique. CONTRAST:  Loading. COMPARISON:  MRI evaluation from September 23, 2021. FINDINGS: CT CHEST FINDINGS Cardiovascular: Scattered calcified aortic atherosclerosis. Normal three-vessel branching pattern. Top normal heart size without pericardial effusion or signs of pericardial nodularity. Central pulmonary vasculature is unremarkable on venous phase. Mediastinum/Nodes: No thoracic inlet, axillary, mediastinal or hilar adenopathy. Esophagus grossly normal. Lungs/Pleura: No effusion. No sign of consolidative changes aside from mild basilar atelectasis and juxta aortic atelectasis or scarring in the LEFT chest in the medial LEFT chest. Airways are patent. Musculoskeletal: See below for full musculoskeletal details. CT ABDOMEN PELVIS FINDINGS Hepatobiliary: Suggestion of  mild steatosis. Fissural widening of hepatic fissures. Mild posterior RIGHT hepatic notching. Portal vein is patent. Lesion previously discussed on the MRI in the RIGHT hemiliver measuring approximately 3.5 x 3.1 cm is unchanged. Pericholecystic fluid in stranding better seen on recent MR imaging. Pancreas: Normal, without mass, inflammation or ductal dilatation. Spleen: Normal. Adrenals/Urinary Tract: Adrenal glands are normal. Symmetric renal enhancement without hydronephrosis or suspicious renal lesion. Benign renal cysts again not requiring further workup and better characterized on recent MR evaluation. No perivesical stranding. Stomach/Bowel: Appendix is normal. No acute gastric or small bowel process. Inflammation in the RIGHT upper quadrant and or edema is centered more about the gallbladder than the duodenum. Small bowel is normal caliber and ingested contrast media passes into the distal small bowel. The contrast media proceeds into the colon in the appendix is normal. Vascular/Lymphatic: Aortic  atherosclerosis. No sign of aneurysm. Smooth contour of the IVC. There is no gastrohepatic or hepatoduodenal ligament lymphadenopathy. No retroperitoneal or mesenteric lymphadenopathy. No pelvic sidewall lymphadenopathy. Reproductive: Unremarkable by CT. Other: Extensive edema about the RIGHT lower extremity in the setting of recent ORIF of a comminuted RIGHT intratrochanteric fracture now post ORIF and with incomplete imaging of hardware and associated fracture. Gas in the local soft tissues in some stranding tracking along the RIGHT flank ule related to surgery. No additional signs of acute bony abnormality. No destructive bone process. Musculoskeletal: See above. IMPRESSION: 1. 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 If the patient has definitive risk factors for cirrhosis this would be characterized as a LI-RADS category 5 lesion. Referral to multi disciplinary oncologic setting is suggested. 2. Suggestion of mild steatosis. Fissural widening of the hepatic fissures and posterior RIGHT hepatic notching. Findings may represent early morphologic changes of cirrhosis but there is no overt evidence of portal hypertension. 3. No signs of metastatic disease. 4. Extensive edema about the RIGHT lower extremity in the setting of recent ORIF of a comminuted RIGHT intratrochanteric fracture now post ORIF and with incomplete imaging of hardware and associated fracture. 5. Aortic atherosclerosis. Aortic Atherosclerosis (ICD10-I70.0). Electronically Signed: By: Zetta Bills M.D. On: 09/23/2021 15:56   MR ABDOMEN W WO CONTRAST  Result Date: 09/23/2021 CLINICAL DATA:  Elevated liver function tests and hyperbilirubinemia. Right hepatic lobe mass on ultrasound, along with thickened gallbladder. EXAM: MRI ABDOMEN WITHOUT AND WITH CONTRAST TECHNIQUE: Multiplanar multisequence MR imaging of the abdomen was performed both before and after the  administration of intravenous contrast. CONTRAST:  62m GADAVIST GADOBUTROL 1 MMOL/ML IV SOLN COMPARISON:  09/22/2021 ultrasound FINDINGS: Despite efforts by the technologist and patient, motion artifact is present on today's exam and could not be eliminated. This reduces exam sensitivity and specificity. Lower chest: Atelectasis in both lower lobes along with suspected cardiomegaly. Hepatobiliary: No definite specific indicators of cirrhosis. T2 hyperintense partially exophytic lesion posteriorly in the right hepatic lobe corresponding to the sonographic finding, measuring 3.3 by 3.1 by 3.7 cm on image 16 series 12. This lesion demonstrates mostly diffuse arterial phase enhancement and on later phases demonstrates both central washout and peripheral enhancing capsule. Substantial abnormal gallbladder wall thickening with high signal intensity in the gallbladder potentially from sludge or blood products. Small amount of surrounding pericholecystic fluid tracking down in the right paracolic gutter. Pancreas:  Unremarkable Spleen: There is some signal dropout in the spleen on in-phase images, query hemosiderosis. Adrenals/Urinary Tract: Single benign renal cysts bilaterally require no further workup. Adrenal glands unremarkable. Stomach/Bowel:  Unremarkable Vascular/Lymphatic:  Unremarkable Other: Bilateral flank edema low-grade edema tracking along the lateral abdominal wall musculature, left greater than right. Musculoskeletal: Low T1 and somewhat low T2 signal marrow intensity bilaterally, differential diagnoses include myelofibrosis, marrow hyperplasia, myeloproliferative diseases, iron deposition related pathologies, sarcoidosis, renal osteodystrophy, and others. IMPRESSION: 1. The 3.7 cm mass posteriorly in the right hepatic lobe is highly suspicious for malignancy, with early arterial phase enhancement, central washout, and peripheral capsule appearance. I do not observe definite morphologic findings of  cirrhosis, and if the patient does not have special risk factors for cirrhosis, then it is not appropriate to administer a LI-RADS category. However, if the patient did have cirrhosis this would be considered LI-RADS category 5, definite hepatocellular carcinoma. Given the absence of obvious morphologic findings of cirrhosis, the diagnosis is not as definite and biopsy might therefore be a consideration. This case may benefit from further discussion in multidisciplinary cancer conference. 2. Low T1 signal in the marrow and signal dropout in the spleen on in-phase images, raising the possibility of hemo siderosis. However, there is less in the way of signal dropout in the liver. Other possible causes of low T1 marrow signal may include myelofibrosis, hyperplasia, myeloproliferative disease, or renal osteodystrophy. 3. Abnormal gallbladder wall thickening and pericholecystic fluid without definite stone, acalculous cholecystitis is a distinct possibility. 4. Mild bibasilar atelectasis. 5. Cardiomegaly. 6. Bilateral flank edema. Electronically Signed   By: Van Clines M.D.   On: 09/23/2021 07:34   DG HIP PORT UNILAT W OR W/O PELVIS 1V RIGHT  Result Date: 09/22/2021 CLINICAL DATA:  Status post surgical internal fixation of right proximal femur fracture. EXAM: DG HIP (WITH OR WITHOUT PELVIS) 1V PORT RIGHT COMPARISON:  None Available. FINDINGS: Status post surgical internal fixation of intertrochanteric fracture involving proximal right femur. Good alignment of fracture components is noted. Expected postoperative changes are seen in the surrounding soft tissues. IMPRESSION: Status post surgical internal fixation of proximal right femoral intertrochanteric fracture. Electronically Signed   By: Marijo Conception M.D.   On: 09/22/2021 15:47   DG FEMUR, MIN 2 VIEWS RIGHT  Result Date: 09/22/2021 CLINICAL DATA:  Surgical internal fixation of proximal right femoral fracture. EXAM: RIGHT FEMUR 2 VIEWS; DG C-ARM 1-60  MIN-NO REPORT Radiation exposure index: 15.87 mGy. COMPARISON:  September 21, 2021. FINDINGS: Six intraoperative fluoroscopic images were obtained of the right hip. These demonstrate surgical internal fixation of intertrochanteric fracture involving the proximal right femur. IMPRESSION: Fluoroscopic guidance provided during surgical internal fixation of proximal right femoral fracture. Electronically Signed   By: Marijo Conception M.D.   On: 09/22/2021 13:59   DG C-Arm 1-60 Min-No Report  Result Date: 09/22/2021 CLINICAL DATA:  Surgical internal fixation of proximal right femoral fracture. EXAM: RIGHT FEMUR 2 VIEWS; DG C-ARM 1-60 MIN-NO REPORT Radiation exposure index: 15.87 mGy. COMPARISON:  September 21, 2021. FINDINGS: Six intraoperative fluoroscopic images were obtained of the right hip. These demonstrate surgical internal fixation of intertrochanteric fracture involving the proximal right femur. IMPRESSION: Fluoroscopic guidance provided during surgical internal fixation of proximal right femoral fracture. Electronically Signed   By: Marijo Conception M.D.   On: 09/22/2021 13:59   DG C-Arm 1-60 Min-No Report  Result Date: 09/22/2021 CLINICAL DATA:  Surgical internal fixation of proximal right femoral fracture. EXAM: RIGHT FEMUR 2 VIEWS; DG C-ARM 1-60 MIN-NO REPORT Radiation exposure index: 15.87 mGy. COMPARISON:  September 21, 2021. FINDINGS: Six intraoperative fluoroscopic images were obtained of the right hip. These demonstrate surgical internal fixation  of intertrochanteric fracture involving the proximal right femur. IMPRESSION: Fluoroscopic guidance provided during surgical internal fixation of proximal right femoral fracture. Electronically Signed   By: Marijo Conception M.D.   On: 09/22/2021 13:59   US Abdomen Complete  Result Date: 09/22/2021 CLINICAL DATA:  Hepatic transaminitis. EXAM: ABDOMEN ULTRASOUND COMPLETE COMPARISON:  None Available. FINDINGS: Gallbladder: The gallbladder wall in general is mildly  thickened up to 4 mm. There was no positive sonographic Murphy's sign. There is trace pericholecystic fluid. Common bile duct: Diameter: 1.6 mm with no intrahepatic biliary prominence. Liver: The liver is diffusely attenuating consistent with steatosis. Some images suggest capsular nodularity over portions of the left lobe which may be seen with cirrhosis. There is a centrally isoechoic peripherally hypoechoic mass in the posteroinferior right lobe of the liver which does not register color Doppler flow and which measures 3.7 x 2.9 x 3.2 cm. Further evaluation to assess for hemangioma or indeterminate mass is recommended. Portal vein is patent on color Doppler imaging with normal direction of blood flow towards the liver. IVC: No abnormality visualized. Pancreas: Visualized portion unremarkable. Spleen: Size and appearance within normal limits. Length measurement 9.6 cm. Right Kidney: Length: 9.6 cm. Echogenicity within normal limits. No mass or hydronephrosis visualized. Left Kidney: Length: 10.5 cm. Echogenicity within normal limits. No mass or hydronephrosis visualized. There is a 1.5 cm simple cyst in the upper pole. Abdominal aorta: No aneurysm visualized. There is mild heterogeneous atherosclerotic plaque. Other findings: No perihepatic ascites except for the trace pericholecystic fluid. IMPRESSION: 1. Mildly thickened gallbladder wall with trace pericholecystic fluid but without sonographic Murphy's sign or stones. Differential diagnosis includes reactive wall thickening from an adjacent inflammatory process, wall thickening due to passive congestion or hepatic dysfunction, acalculous cholecystitis, and infiltrating disease. 2. 3.7 x 2.9 x 3.2 cm hepatic mass posteroinferior right lobe. Although there was no registered color flow, further evaluation needs to be performed to exclude primary or metastatic neoplasm versus a benign entity such as a hemangioma. MRI without and with contrast recommended. 3. Aortic  atherosclerosis without AAA. 4. Small left renal cyst. 5. Hepatic steatosis. Also, some images suggest capsular nodularity in the left lobe which may be seen with cirrhosis. Electronically Signed   By: Telford Nab M.D.   On: 09/22/2021 05:28   CT Head Wo Contrast  Result Date: 09/22/2021 CLINICAL DATA:  Trauma. EXAM: CT HEAD WITHOUT CONTRAST TECHNIQUE: Contiguous axial images were obtained from the base of the skull through the vertex without intravenous contrast. RADIATION DOSE REDUCTION: This exam was performed according to the departmental dose-optimization program which includes automated exposure control, adjustment of the mA and/or kV according to patient size and/or use of iterative reconstruction technique. COMPARISON:  None Available. FINDINGS: Brain: Moderate age-related atrophy and chronic microvascular ischemic changes. Bilateral basal ganglia calcifications noted. There is no acute intracranial hemorrhage. No mass effect or midline shift. No extra-axial fluid collection. Vascular: No hyperdense vessel or unexpected calcification. Skull: Normal. Negative for fracture or focal lesion. Sinuses/Orbits: No acute finding. Other: None IMPRESSION: 1. No acute intracranial pathology. 2. Moderate age-related atrophy and chronic microvascular ischemic changes. Electronically Signed   By: Anner Crete M.D.   On: 09/22/2021 01:15   CT Cervical Spine Wo Contrast  Result Date: 09/22/2021 CLINICAL DATA:  Polytrauma, blunt.  Fall. EXAM: CT CERVICAL SPINE WITHOUT CONTRAST TECHNIQUE: Multidetector CT imaging of the cervical spine was performed without intravenous contrast. Multiplanar CT image reconstructions were also generated. RADIATION DOSE REDUCTION: This exam was performed  according to the departmental dose-optimization program which includes automated exposure control, adjustment of the mA and/or kV according to patient size and/or use of iterative reconstruction technique. COMPARISON:  None  Available. FINDINGS: Alignment: No subluxation Skull base and vertebrae: No acute fracture. No primary bone lesion or focal pathologic process. Soft tissues and spinal canal: No prevertebral fluid or swelling. No visible canal hematoma. Disc levels:  Disc spaces maintained.  No visible disc herniation. Upper chest: No acute findings Other: None IMPRESSION: No acute bony abnormality. Electronically Signed   By: Rolm Baptise M.D.   On: 09/22/2021 01:11   DG FEMUR, MIN 2 VIEWS RIGHT  Result Date: 09/22/2021 CLINICAL DATA:  Encounter for fracture EXAM: RIGHT FEMUR 2 VIEWS COMPARISON:  09/21/2021 FINDINGS: Intramedullary nail within the right femur. There is a right femoral intertrochanteric fracture with varus angulation. No additional acute bony abnormality. No subluxation or dislocation. IMPRESSION: Right intertrochanteric fracture with varus angulation. Electronically Signed   By: Rolm Baptise M.D.   On: 09/22/2021 00:55   DG Chest Port 1 View  Result Date: 09/21/2021 CLINICAL DATA:  Hip fracture.  Medical clearance for surgery. EXAM: PORTABLE CHEST 1 VIEW COMPARISON:  06/27/2008 FINDINGS: Lungs are well expanded, symmetric, and clear. No pneumothorax or pleural effusion. Cardiac size within normal limits. Pulmonary vascularity is normal. Osseous structures are age-appropriate. No acute bone abnormality. IMPRESSION: No active disease. Electronically Signed   By: Fidela Salisbury M.D.   On: 09/21/2021 23:42   DG Hip Unilat  With Pelvis 2-3 Views Right  Result Date: 09/21/2021 CLINICAL DATA:  Fall EXAM: DG HIP (WITH OR WITHOUT PELVIS) 2-3V RIGHT COMPARISON:  06/27/2008 FINDINGS: Right femoral intramedullary rod in place. There is a right femoral intertrochanteric fracture just above the intramedullary nail. Varus angulation. No subluxation or dislocation. Hip joints and SI joints symmetric. IMPRESSION: Right femoral intertrochanteric fracture with varus angulation. Fracture is just above the indwelling  intramedullary nail. Electronically Signed   By: Rolm Baptise M.D.   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 Discussed imaging results and tumor markers with the patient which are concerning for [facility], however, MRI not definitive in the absence of cirrhosis CT imaging show no evidence of metastatic disease I do not believe biopsy is indicated I agree on multidisciplinary approach Due to recent hip surgery, the patient is not a candidate for surgical resection I will get my GI oncologist colleagues to take over his care and present his case in [facility] clinic next week for further evaluation and follow-up  Transaminitis due to underlying hepatitis C and malignancy Hepatitis C antibody with HCV RNA pending He has no evidence of cirrhosis on imaging Monitor liver function closely May benefit from hep C treatment as an outpatient  Normocytic anemia No current bleeding May be dilutional Monitor  Thrombocytopenia Has had mild thrombocytopenia dating back at least 13 years May be related to alcohol use Platelets slowly trending upward Monitor  Alcohol and tobacco dependence Recommend abstinence from alcohol and smoking cessation  Hip fracture status post repair Recommend keeping him over the weekend to monitor for complications  Thank you for this referral.  I will consult with my GI oncologist colleagues to take over We will arrange follow-up in the outpatient clinic for further discussion and management  Mikey Bussing, DNP, AGPCNP-BC, AOCNP  Heath Lark, MD
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
## 2021-09-24 ENCOUNTER — Inpatient Hospital Stay (HOSPITAL_COMMUNITY): Payer: Medicare Other

## 2021-09-24 DIAGNOSIS — G929 Unspecified toxic encephalopathy: Secondary | ICD-10-CM

## 2021-09-24 DIAGNOSIS — D62 Acute posthemorrhagic anemia: Secondary | ICD-10-CM

## 2021-09-24 LAB — HEMOGLOBIN AND HEMATOCRIT, BLOOD
HCT: 20.7 % — ABNORMAL LOW (ref 39.0–52.0)
HCT: 25.8 % — ABNORMAL LOW (ref 39.0–52.0)
Hemoglobin: 7.3 g/dL — ABNORMAL LOW (ref 13.0–17.0)
Hemoglobin: 8.8 g/dL — ABNORMAL LOW (ref 13.0–17.0)

## 2021-09-24 LAB — COMPREHENSIVE METABOLIC PANEL
ALT: 44 U/L (ref 0–44)
AST: 92 U/L — ABNORMAL HIGH (ref 15–41)
Albumin: 2 g/dL — ABNORMAL LOW (ref 3.5–5.0)
Alkaline Phosphatase: 59 U/L (ref 38–126)
Anion gap: 6 (ref 5–15)
BUN: 20 mg/dL (ref 8–23)
CO2: 22 mmol/L (ref 22–32)
Calcium: 7.5 mg/dL — ABNORMAL LOW (ref 8.9–10.3)
Chloride: 107 mmol/L (ref 98–111)
Creatinine, Ser: 1.14 mg/dL (ref 0.61–1.24)
GFR, Estimated: >60 mL/min (ref >60)
Glucose, Bld: 133 mg/dL — ABNORMAL HIGH (ref 70–99)
Potassium: 3.6 mmol/L (ref 3.5–5.1)
Sodium: 135 mmol/L (ref 135–145)
Total Bilirubin: 1.3 mg/dL — ABNORMAL HIGH (ref 0.3–1.2)
Total Protein: 4.8 g/dL — ABNORMAL LOW (ref 6.5–8.1)

## 2021-09-24 LAB — PROTIME-INR
INR: 1.4 — ABNORMAL HIGH (ref 0.8–1.2)
Prothrombin Time: 16.8 seconds — ABNORMAL HIGH (ref 11.4–15.2)

## 2021-09-24 LAB — CBC
HCT: 19.1 % — ABNORMAL LOW (ref 39.0–52.0)
Hemoglobin: 6.5 g/dL — CL (ref 13.0–17.0)
MCH: 33.3 pg (ref 26.0–34.0)
MCHC: 34 g/dL (ref 30.0–36.0)
MCV: 97.9 fL (ref 80.0–100.0)
Platelets: 44 10*3/uL — ABNORMAL LOW (ref 150–400)
RBC: 1.95 MIL/uL — ABNORMAL LOW (ref 4.22–5.81)
RDW: 14.6 % (ref 11.5–15.5)
WBC: 6.9 10*3/uL (ref 4.0–10.5)
nRBC: 0 % (ref 0.0–0.2)

## 2021-09-24 LAB — RETICULOCYTES
Immature Retic Fract: 34.7 % — ABNORMAL HIGH (ref 2.3–15.9)
RBC.: 1.94 MIL/uL — ABNORMAL LOW (ref 4.22–5.81)
Retic Count, Absolute: 148.8 10*3/uL (ref 19.0–186.0)
Retic Ct Pct: 7.7 % — ABNORMAL HIGH (ref 0.4–3.1)

## 2021-09-24 LAB — FOLATE: Folate: 13.3 ng/mL (ref >5.9)

## 2021-09-24 LAB — FERRITIN: Ferritin: 1418 ng/mL — ABNORMAL HIGH (ref 24–336)

## 2021-09-24 LAB — MAGNESIUM: Magnesium: 1.9 mg/dL (ref 1.7–2.4)

## 2021-09-24 LAB — IRON AND TIBC
Iron: 99 ug/dL (ref 45–182)
Saturation Ratios: 52 % — ABNORMAL HIGH (ref 17.9–39.5)
TIBC: 189 ug/dL — ABNORMAL LOW (ref 250–450)
UIBC: 90 ug/dL

## 2021-09-24 LAB — VITAMIN B12: Vitamin B-12: 686 pg/mL (ref 180–914)

## 2021-09-24 LAB — CK: Total CK: 1416 U/L — ABNORMAL HIGH (ref 49–397)

## 2021-09-24 LAB — PHOSPHORUS: Phosphorus: 2.5 mg/dL (ref 2.5–4.6)

## 2021-09-24 LAB — PREPARE RBC (CROSSMATCH)

## 2021-09-24 MED ORDER — SODIUM CHLORIDE 0.9% IV SOLUTION
Freq: Once | INTRAVENOUS | Status: AC
Start: 2021-09-24 — End: 2021-09-24
  Administered 2021-09-24: 125 mL via INTRAVENOUS

## 2021-09-24 MED ORDER — METHOCARBAMOL 500 MG PO TABS
500.0000 mg | ORAL_TABLET | Freq: Three times a day (TID) | ORAL | Status: DC | PRN
  Administered 2021-09-24 – 2021-09-25 (×2): 500 mg via ORAL
  Filled 2021-09-24 (×2): qty 1

## 2021-09-24 MED ORDER — ACETAMINOPHEN 325 MG PO TABS
650.0000 mg | ORAL_TABLET | Freq: Four times a day (QID) | ORAL | Status: DC
  Administered 2021-09-25 – 2021-09-27 (×8): 650 mg via ORAL
  Filled 2021-09-24 (×8): qty 2

## 2021-09-24 MED ORDER — NALOXONE HCL 0.4 MG/ML IJ SOLN: 0.4000 mg | INTRAMUSCULAR | Status: DC | PRN

## 2021-09-24 MED ORDER — NALOXONE HCL 0.4 MG/ML IJ SOLN
0.4000 mg | Freq: Once | INTRAMUSCULAR | Status: AC
Start: 2021-09-24 — End: 2021-09-24

## 2021-09-24 MED ORDER — NALOXONE HCL 0.4 MG/ML IJ SOLN
INTRAMUSCULAR | Status: AC
  Administered 2021-09-24: 0.4 mg via INTRAVENOUS
  Filled 2021-09-24: qty 1

## 2021-09-24 MED ORDER — FOLIC ACID 5 MG/ML IJ SOLN
1.0000 mg | Freq: Once | INTRAMUSCULAR | Status: AC
  Administered 2021-09-24: 1 mg via INTRAVENOUS
  Filled 2021-09-24: qty 0.2

## 2021-09-24 NOTE — Progress Notes (Signed)
Date and time results received: 09/24/21 0315 (use smartphrase ".now" to insert current time)  Test: Hemoglobin Critical Value: 6.5  Name of Provider Notified: Babs Bertin MD  Orders Received? Or Actions Taken?: Orders Received - See Orders for details See Mar for New Orders.

## 2021-09-24 NOTE — Progress Notes (Signed)
TRH floor coverage for both [facility] and [facility] (remote) on night of 09/23/21 into morning of 09/24/21:    I was notified by RN of hemoglobin value of 6.5 this morning.  Vital signs stable, including normotensive, with heart rates in the 90s.  It appears that the patient's hemoglobin has been gradually trending down over the last few days, noting hemoglobin of 8.3 on the morning of 09/23/2021, preceded by 10.9 on 09/22/2021, and 12.7 on 09/21/2021.  Type and screen previously completed.  I subsequently ordered transfuse 1 unit PRBC over 2 hours, with repeat hemoglobin level ordered for 8 AM this morning.     Babs Bertin, DO Hospitalist

## 2021-09-24 NOTE — Progress Notes (Signed)
Patient very drowsy this AM. Given pain medications and ativan last night. Provider paged to bedside to assess patient.   One time dose of narcan given. Patient awake. In and out of sleep. Pulling of TELE leads and gown. TELE discontinued. Mitts on to prevent patient from pulling at surgical drsg and IV.   VSS. Patient on room air.

## 2021-09-24 NOTE — Plan of Care (Signed)

## 2021-09-24 NOTE — Plan of Care (Signed)
  Problem: Clinical Measurements: Goal: Will remain free from infection Outcome: Progressing Goal: Diagnostic test results will improve Outcome: Progressing Goal: Respiratory complications will improve Outcome: Progressing   Problem: Elimination: Goal: Will not experience complications related to bowel motility Outcome: Progressing   Problem: Pain Managment: Goal: General experience of comfort will improve Outcome: Progressing
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
## 2021-09-24 NOTE — Progress Notes (Signed)
OT Cancellation Note  Patient Details Name: Maxden Naji MRN: [id number] DOB: 1946-01-20   Cancelled Treatment:    Reason Eval/Treat Not Completed: Medical issues which prohibited therapy.  Noted HGB 6.8 with an order for PRBC.  OT to check on once transfusion starts as schedule allows.    Dsean Vantol D Zurich Carreno 09/24/2021, 11:48 AM
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
## 2021-09-25 DIAGNOSIS — S72141D Displaced intertrochanteric fracture of right femur, subsequent encounter for closed fracture with routine healing: Secondary | ICD-10-CM

## 2021-09-25 DIAGNOSIS — W19XXXD Unspecified fall, subsequent encounter: Secondary | ICD-10-CM

## 2021-09-25 LAB — COMPREHENSIVE METABOLIC PANEL
ALT: 45 U/L — ABNORMAL HIGH (ref 0–44)
AST: 98 U/L — ABNORMAL HIGH (ref 15–41)
Albumin: 2 g/dL — ABNORMAL LOW (ref 3.5–5.0)
Alkaline Phosphatase: 56 U/L (ref 38–126)
Anion gap: 6 (ref 5–15)
BUN: 12 mg/dL (ref 8–23)
CO2: 21 mmol/L — ABNORMAL LOW (ref 22–32)
Calcium: 7.5 mg/dL — ABNORMAL LOW (ref 8.9–10.3)
Chloride: 109 mmol/L (ref 98–111)
Creatinine, Ser: 0.92 mg/dL (ref 0.61–1.24)
GFR, Estimated: >60 mL/min (ref >60)
Glucose, Bld: 109 mg/dL — ABNORMAL HIGH (ref 70–99)
Potassium: 3.6 mmol/L (ref 3.5–5.1)
Sodium: 136 mmol/L (ref 135–145)
Total Bilirubin: 1.8 mg/dL — ABNORMAL HIGH (ref 0.3–1.2)
Total Protein: 5 g/dL — ABNORMAL LOW (ref 6.5–8.1)

## 2021-09-25 LAB — CBC
HCT: 22.4 % — ABNORMAL LOW (ref 39.0–52.0)
Hemoglobin: 7.8 g/dL — ABNORMAL LOW (ref 13.0–17.0)
MCH: 32.8 pg (ref 26.0–34.0)
MCHC: 34.8 g/dL (ref 30.0–36.0)
MCV: 94.1 fL (ref 80.0–100.0)
Platelets: 47 10*3/uL — ABNORMAL LOW (ref 150–400)
RBC: 2.38 MIL/uL — ABNORMAL LOW (ref 4.22–5.81)
RDW: 15.9 % — ABNORMAL HIGH (ref 11.5–15.5)
WBC: 4.8 10*3/uL (ref 4.0–10.5)
nRBC: 0 % (ref 0.0–0.2)

## 2021-09-25 LAB — PROTIME-INR
INR: 1.3 — ABNORMAL HIGH (ref 0.8–1.2)
Prothrombin Time: 16.1 seconds — ABNORMAL HIGH (ref 11.4–15.2)

## 2021-09-25 LAB — TYPE AND SCREEN
ABO/RH(D): O POS
Antibody Screen: NEGATIVE
Unit division: 0

## 2021-09-25 LAB — BPAM RBC
Blood Product Expiration Date: 202306262359
ISSUE DATE / TIME: 202306170344
Unit Type and Rh: 5100

## 2021-09-25 LAB — HEMOGLOBIN AND HEMATOCRIT, BLOOD
HCT: 25.4 % — ABNORMAL LOW (ref 39.0–52.0)
Hemoglobin: 8.9 g/dL — ABNORMAL LOW (ref 13.0–17.0)

## 2021-09-25 LAB — PHOSPHORUS: Phosphorus: 2.6 mg/dL (ref 2.5–4.6)

## 2021-09-25 LAB — MAGNESIUM: Magnesium: 1.7 mg/dL (ref 1.7–2.4)

## 2021-09-25 LAB — CK: Total CK: 1117 U/L — ABNORMAL HIGH (ref 49–397)

## 2021-09-25 LAB — AMMONIA: Ammonia: 17 umol/L (ref 9–35)

## 2021-09-25 LAB — TSH: TSH: 3.037 u[IU]/mL (ref 0.350–4.500)
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
## 2021-09-26 LAB — HEMOGLOBIN AND HEMATOCRIT, BLOOD
HCT: 23.1 % — ABNORMAL LOW (ref 39.0–52.0)
Hemoglobin: 8.1 g/dL — ABNORMAL LOW (ref 13.0–17.0)

## 2021-09-26 MED ORDER — VITAMIN D 25 MCG (1000 UNIT) PO TABS
1000.0000 [IU] | ORAL_TABLET | Freq: Every day | ORAL | Status: DC
  Administered 2021-09-26 – 2021-09-27 (×2): 1000 [IU] via ORAL
  Filled 2021-09-26 (×2): qty 1

## 2021-09-26 MED ORDER — VITAMIN D (CHOLECALCIFEROL) 25 MCG (1000 UT) PO TABS: 1.0000 | ORAL_TABLET | Freq: Every day | ORAL | 0 refills | Status: DC

## 2021-09-26 MED ORDER — APIXABAN 2.5 MG PO TABS: 2.5000 mg | ORAL_TABLET | Freq: Two times a day (BID) | ORAL | 0 refills | Status: DC

## 2021-09-26 MED ORDER — METHOCARBAMOL 500 MG PO TABS: 500.0000 mg | ORAL_TABLET | Freq: Three times a day (TID) | ORAL | 0 refills | Status: DC | PRN

## 2021-09-26 MED ORDER — ACETAMINOPHEN 325 MG PO TABS: 650.0000 mg | ORAL_TABLET | Freq: Four times a day (QID) | ORAL | 0 refills | Status: DC | PRN
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
## 2021-09-26 NOTE — Care Management Important Message (Signed)
Important Message  Patient Details  Name: John Padilla MRN: [id number] Date of Birth: January 29, 1946   Medicare Important Message Given:  Yes     Memory Argue 09/26/2021, 2:52 PM

## 2021-09-26 NOTE — Plan of Care (Signed)
  Problem: Pain Managment: Goal: General experience of comfort will improve Outcome: Progressing   Problem: Safety: Goal: Ability to remain free from injury will improve Outcome: Progressing   Problem: Skin Integrity: Goal: Risk for impaired skin integrity will decrease Outcome: Progressing   
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
## 2021-09-26 NOTE — Discharge Instructions (Signed)
Orthopaedic Trauma Service Discharge Instructions   General Discharge Instructions  WEIGHT BEARING STATUS:Weightbearing as tolerated  RANGE OF MOTION/ACTIVITY: ok for knee and hip motion as tolerated  Wound Care: Incisions can be left open to air if there is no drainage. If incision continues to have drainage, follow wound care instructions below. Okay to shower if no drainage from incisions.  DVT/PE prophylaxis:  Eliquis 2.5 mg twice daily x 30 days  Diet: as you were eating previously.  Can use over the counter stool softeners and bowel preparations, such as Miralax, to help with bowel movements.  Narcotics can be constipating.  Be sure to drink plenty of fluids  PAIN MEDICATION USE AND EXPECTATIONS  You have likely been given narcotic medications to help control your pain.  After a traumatic event that results in an fracture (broken bone) with or without surgery, it is ok to use narcotic pain medications to help control one's pain.  We understand that everyone responds to pain differently and each individual patient will be evaluated on a regular basis for the continued need for narcotic medications. Ideally, narcotic medication use should last no more than 6-8 weeks (coinciding with fracture healing).   As a patient it is your responsibility as well to monitor narcotic medication use and report the amount and frequency you use these medications when you come to your office visit.   We would also advise that if you are using narcotic medications, you should take a dose prior to therapy to maximize you participation.  IF YOU ARE ON NARCOTIC MEDICATIONS IT IS NOT PERMISSIBLE TO OPERATE A MOTOR VEHICLE (MOTORCYCLE/CAR/TRUCK/MOPED) OR HEAVY MACHINERY DO NOT MIX NARCOTICS WITH OTHER CNS (CENTRAL NERVOUS SYSTEM) DEPRESSANTS SUCH AS ALCOHOL   STOP SMOKING OR USING NICOTINE PRODUCTS!!!!  As discussed nicotine severely impairs your body's ability to heal surgical and traumatic wounds but also  impairs bone healing.  Wounds and bone heal by forming microscopic blood vessels (angiogenesis) and nicotine is a vasoconstrictor (essentially, shrinks blood vessels).  Therefore, if vasoconstriction occurs to these microscopic blood vessels they essentially disappear and are unable to deliver necessary nutrients to the healing tissue.  This is one modifiable factor that you can do to dramatically increase your chances of healing your injury.    (This means no smoking, no nicotine gum, patches, etc)  DO NOT USE NONSTEROIDAL ANTI-INFLAMMATORY DRUGS (NSAID'S)  Using products such as Advil (ibuprofen), Aleve (naproxen), Motrin (ibuprofen) for additional pain control during fracture healing can delay and/or prevent the healing response.  If you would like to take over the counter (OTC) medication, Tylenol (acetaminophen) is ok.  However, some narcotic medications that are given for pain control contain acetaminophen as well. Therefore, you should not exceed more than 4000 mg of tylenol in a day if you do not have liver disease.  Also note that there are may OTC medicines, such as cold medicines and allergy medicines that my contain tylenol as well.  If you have any questions about medications and/or interactions please ask your doctor/PA or your pharmacist.      ICE AND ELEVATE INJURED/OPERATIVE EXTREMITY  Using ice and elevating the injured extremity above your heart can help with swelling and pain control.  Icing in a pulsatile fashion, such as 20 minutes on and 20 minutes off, can be followed.    Do not place ice directly on skin. Make sure there is a barrier between to skin and the ice pack.    Using frozen items such  as frozen peas works well as the conform nicely to the are that needs to be iced.  USE AN ACE WRAP OR TED HOSE FOR SWELLING CONTROL  In addition to icing and elevation, Ace wraps or TED hose are used to help limit and resolve swelling.  It is recommended to use Ace wraps or TED hose until  you are informed to stop.    When using Ace Wraps start the wrapping distally (farthest away from the body) and wrap proximally (closer to the body)   Example: If you had surgery on your leg or thing and you do not have a splint on, start the ace wrap at the toes and work your way up to the thigh        If you had surgery on your upper extremity and do not have a splint on, start the ace wrap at your fingers and work your way up to the upper arm   CALL THE OFFICE FOR MEDICATION REFILL OR WITH ANY QUESTIONS/CONCERNS: [phone]   VISIT OUR WEBSITE FOR ADDITIONAL INFORMATION: [email or web address]    Discharge Wound Care Instructions  Do NOT apply any ointments, solutions or lotions to pin sites or surgical wounds.  These prevent needed drainage and even though solutions like hydrogen peroxide kill bacteria, they also damage cells lining the pin sites that help fight infection.  Applying lotions or ointments can keep the wounds moist and can cause them to breakdown and open up as well. This can increase the risk for infection. When in doubt call the office.  If any drainage is noted, use foam dressing ([place])  Once the incision is completely dry and without drainage, it may be left open to air out.  Showering may begin 36-48 hours later.  Cleaning gently with soap and water.
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
## 2021-09-26 NOTE — TOC Progression Note (Addendum)
Transition of Care [facility]) - Progression Note    Patient Details  Name: John Padilla MRN: [id number] Date of Birth: Aug 04, 1945  [facility] CM/SW Contact  Joanne Chars, LCSW Phone Number: 09/26/2021, 9:40 AM  Clinical Narrative:   CSW presented bed offers to pt daughter in room, choice document provided.  They will review options.   1100: Per MD, pt stable for DC.  CSW spoke with daughter and updated her.  She is going to tour [facility] and will call CSW afterwards.  1515: daughter would like to accept bed offer at [facility].  MD informed.  Expected Discharge Plan: [place] Barriers to Discharge: Continued Medical Work up  Expected Discharge Plan and Services Expected Discharge Plan: [place] Choice: [place] arrangements for the past 2 months: Single Family Home                   DME Agency: NA                   Social Determinants of Health (SDOH) Interventions    Readmission Risk Interventions     No data to display
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
## 2021-09-27 ENCOUNTER — Other Ambulatory Visit: Payer: Self-pay | Admitting: Oncology

## 2021-09-27 DIAGNOSIS — R160 Hepatomegaly, not elsewhere classified: Secondary | ICD-10-CM

## 2021-09-27 MED ORDER — FOLIC ACID 1 MG PO TABS: 1.0000 mg | ORAL_TABLET | Freq: Every day | ORAL | 0 refills | Status: DC

## 2021-09-27 MED ORDER — MELATONIN 5 MG PO TABS: 5.0000 mg | ORAL_TABLET | Freq: Every evening | ORAL | 0 refills | Status: DC | PRN

## 2021-09-27 MED ORDER — ADULT MULTIVITAMIN W/MINERALS CH: 1.0000 | ORAL_TABLET | Freq: Every day | ORAL | Status: DC

## 2021-09-27 MED ORDER — DOCUSATE SODIUM 100 MG PO CAPS: 100.0000 mg | ORAL_CAPSULE | Freq: Two times a day (BID) | ORAL | 0 refills | Status: DC

## 2021-09-27 MED ORDER — POLYETHYLENE GLYCOL 3350 17 G PO PACK: 17.0000 g | PACK | Freq: Every day | ORAL | 0 refills | Status: DC | PRN

## 2021-09-27 MED ORDER — THIAMINE HCL 100 MG PO TABS: 100.0000 mg | ORAL_TABLET | Freq: Every day | ORAL | Status: DC
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
## 2021-09-27 NOTE — TOC Transition Note (Signed)
Transition of Care [facility]) - CM/SW Discharge Note   Patient Details  Name: John Padilla MRN: [id number] Date of Birth: Oct 12, 1945  [facility] CM/SW Contact:  Joanne Chars, LCSW Phone Number: 09/27/2021, 10:41 AM   Clinical Narrative:   Pt discharging to [facility].  RN call report to [phone].      Final next level of care: Skilled Nursing Facility Barriers to Discharge: Barriers Resolved   Patient Goals and CMS Choice Patient states their goals for this hospitalization and ongoing recovery are:: family wants patient to get rehab   Choice offered to / list presented to : Adult Children, Patient  Discharge Placement              Patient chooses bed at: [facility] Patient to be transferred to facility by: [place] Name of family member notified: daughter Ubaldo Glassing Patient and family notified of of transfer: 09/27/21  Discharge Plan and Services     Post Acute Care Choice: [place]            DME Agency: NA                  Social Determinants of Health (SDOH) Interventions     Readmission Risk Interventions     No data to display

## 2021-09-27 NOTE — Plan of Care (Deleted)
BLE and right buttocks wound dressing changed. Pt with bowel incontinence, had a large BM this AM. CHG wipes done.   Problem: Education: Goal: Knowledge of General Education information will improve Description: Including pain rating scale, medication(s)/side effects and non-pharmacologic comfort measures Outcome: Progressing   Problem: Health Behavior/Discharge Planning: Goal: Ability to manage health-related needs will improve Outcome: Progressing   Problem: Clinical Measurements: Goal: Ability to maintain clinical measurements within normal limits will improve Outcome: Progressing

## 2021-09-28 ENCOUNTER — Ambulatory Visit: Payer: Self-pay | Admitting: Hematology

## 2021-10-04 LAB — HCV RT-PCR, QUANT (NON-GRAPH)
HCV log10: 5.566 log10 IU/mL
Hepatitis C Quantitation: 368000 IU/mL

## 2021-10-04 LAB — HCV AB W REFLEX TO QUANT PCR: HCV Ab: REACTIVE — AB

## 2021-10-05 ENCOUNTER — Inpatient Hospital Stay: Payer: Self-pay | Attending: Hematology | Admitting: Hematology

## 2021-10-05 ENCOUNTER — Encounter: Payer: Self-pay | Admitting: Hematology

## 2021-10-05 ENCOUNTER — Other Ambulatory Visit: Payer: Self-pay

## 2021-10-05 DIAGNOSIS — C220 Liver cell carcinoma: Secondary | ICD-10-CM

## 2021-10-05 DIAGNOSIS — R160 Hepatomegaly, not elsewhere classified: Secondary | ICD-10-CM

## 2021-10-05 DIAGNOSIS — B1920 Unspecified viral hepatitis C without hepatic coma: Secondary | ICD-10-CM | POA: Insufficient documentation

## 2021-10-05 DIAGNOSIS — Z7901 Long term (current) use of anticoagulants: Secondary | ICD-10-CM | POA: Insufficient documentation

## 2021-10-05 DIAGNOSIS — D696 Thrombocytopenia, unspecified: Secondary | ICD-10-CM | POA: Insufficient documentation

## 2021-10-05 DIAGNOSIS — D649 Anemia, unspecified: Secondary | ICD-10-CM | POA: Insufficient documentation

## 2021-10-05 DIAGNOSIS — F17210 Nicotine dependence, cigarettes, uncomplicated: Secondary | ICD-10-CM | POA: Insufficient documentation

## 2021-10-05 HISTORY — DX: Liver cell carcinoma: C22.0
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
## 2021-10-05 NOTE — Progress Notes (Signed)
I met with Mr Nedved, his wife, and daughter, Ubaldo Glassing after his consultation with Dr Burr Medico.  I explained my role as a nurse navigator and provided my contact information.  All questions were answered.  They verbalized understanding.

## 2021-10-06 ENCOUNTER — Encounter: Payer: Self-pay | Admitting: Licensed Clinical Social Worker

## 2021-10-06 DIAGNOSIS — C220 Liver cell carcinoma: Secondary | ICD-10-CM
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
## 2021-10-07 ENCOUNTER — Encounter: Payer: Self-pay | Admitting: Infectious Diseases

## 2021-10-07 ENCOUNTER — Other Ambulatory Visit: Payer: Self-pay

## 2021-10-07 ENCOUNTER — Ambulatory Visit (INDEPENDENT_AMBULATORY_CARE_PROVIDER_SITE_OTHER): Payer: Self-pay | Admitting: Infectious Diseases

## 2021-10-07 DIAGNOSIS — C220 Liver cell carcinoma: Secondary | ICD-10-CM

## 2021-10-07 DIAGNOSIS — K760 Fatty (change of) liver, not elsewhere classified: Secondary | ICD-10-CM

## 2021-10-07 DIAGNOSIS — B182 Chronic viral hepatitis C: Secondary | ICD-10-CM

## 2021-10-07 HISTORY — DX: Chronic viral hepatitis C: B18.2

## 2021-10-07 NOTE — Progress Notes (Signed)
Patient Name: John Padilla  Date of Birth: 02/24/1946  MRN: [id number]  PCP: Oneita Hurt No  Referring Provider: Kathlen Mody, MD, Ph#: [phone]   CC:  New patient - initial evaluation and management of chronic hepatitis C infection.     HPI/ROS:  John Padilla is a 76 y.o. male here with his family for initial consultation for management of chronic hepatitis C infection. Interpretor is also present at the visit.   Referred by Dr. Mosetta Putt - they recently evaluated him and are working on a treatment plan for hepatocellular carcinoma, cT2N0M0, stage IB. All of this was recently realized after he had a fall with femoral neck fracture that required hospitalization. Work up of his elevated LFTs revealed 3.7 cm hepatic mass on [place] with AFP > 2000. MR with typical radiographic features of HCC without any radiographic findings of cirrhosis. There is no signs of node or metastatic disease on CAP CT scan done 09/23/2021. He is in the process of referral to Dr. Freida Busman or Dr. Donell Beers at [facility] for consideration of resection for primary treatment. Final planning for this is in queue based on further discussions with surgeon team and GI conference next week. Tentative surgeon consultation appt on 10/21/2021 .  Lived in [place] with 11 family members when he was young. He is the only child left amongst his other siblings. Many of the elder family members passed away. 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  HIV never assessed  Hep BsAg non reactive (09/22/21)  He has a problem with eating - not eating as much but never has been a big eater. Resides at a SNF at the moment after his recent orthopedic surgery. He had not had any beer while he has been here but does enjoy drinking this normally.    Review of Systems  Constitutional:  Positive for activity change and appetite change. Negative for chills, diaphoresis, fatigue and fever.   All other systems reviewed and are  negative      Past Medical History:  Diagnosis Date   Chronic hepatitis C without hepatic coma (HCC) 10/07/2021   Hepatocellular carcinoma (HCC) 10/05/2021    Prior to Admission medications   Medication Sig Start Date End Date Taking? Authorizing Provider  acetaminophen (TYLENOL) 325 MG tablet Take 2 tablets (650 mg total) by mouth every 6 (six) hours as needed for mild pain or moderate pain. 09/26/21   [place] Bali, PA-C  apixaban (ELIQUIS) 2.5 MG TABS tablet Take 1 tablet (2.5 mg total) by mouth 2 (two) times daily. 09/26/21 10/26/21  [place] Bali, PA-C  docusate sodium (COLACE) 100 MG capsule Take 1 capsule (100 mg total) by mouth 2 (two) times daily. 09/27/21   Kathlen Mody, MD  folic acid (FOLVITE) 1 MG tablet Take 1 tablet (1 mg total) by mouth daily. 09/28/21   Kathlen Mody, MD  melatonin 5 MG TABS Take 1 tablet (5 mg total) by mouth at bedtime as needed. 09/27/21   Kathlen Mody, MD  methocarbamol (ROBAXIN) 500 MG tablet Take 1 tablet (500 mg total) by mouth every 8 (eight) hours as needed for muscle spasms. 09/26/21   [place] Bali, PA-C  Multiple Vitamin (MULTIVITAMIN WITH MINERALS) TABS tablet Take 1 tablet by mouth daily. 09/28/21   Kathlen Mody, MD  polyethylene glycol (MIRALAX / GLYCOLAX) 17 g packet Take 17 g by mouth daily as needed for mild constipation. 09/27/21   Kathlen Mody, MD  thiamine 100  MG tablet Take 1 tablet (100 mg total) by mouth daily. 09/28/21   Kathlen Mody, MD  Vitamin D, Cholecalciferol, 25 MCG (1000 UT) TABS Take 1 tablet by mouth daily. 09/26/21   West Bali, PA-C    No Known Allergies  Social History   Tobacco Use   Smoking status: Every Day    Packs/day: 0.50    Years: 40.00    Total pack years: 20.00    Types: Cigarettes   Smokeless tobacco: Never  Vaping Use   Vaping Use: Never used  Substance Use Topics   Alcohol use: Yes    Alcohol/week: 2.0 standard drinks of alcohol    Types: 2 Cans of beer per week    Comment: Daily for 40  years   Drug use: Never    No family history on file. - he is not aware of any of his family's medical history   Objective:   Vitals:   10/07/21 0918  BP: (!) 175/75  Pulse: 67  Temp: 98.9 F (37.2 C)   Constitutional: in no apparent distress, oriented times 3, and normal vitals Eyes: anicteric Cardiovascular: Cor RRR Respiratory: clear Gastrointestinal: Bowel sounds are normal, liver is not enlarged, spleen is not enlarged Musculoskeletal: peripheral pulses normal, no pedal edema, no clubbing or cyanosis Skin: negative for - jaundice, spider hemangioma, telangiectasia, palmar erythema, ecchymosis and atrophy; no porphyria cutanea tarda Lymphatic: no cervical lymphadenopathy   Laboratory: Genotype: No results found for: "HCVGENOTYPE" HCV viral load: No results found for: "HCVQUANT" Lab Results  Component Value Date   WBC 4.8 09/25/2021   HGB 8.1 (L) 09/26/2021   HCT 23.1 (L) 09/26/2021   MCV 94.1 09/25/2021   PLT 47 (L) 09/25/2021    Lab Results  Component Value Date   CREATININE 0.92 09/25/2021   BUN 12 09/25/2021   NA 136 09/25/2021   K 3.6 09/25/2021   CL 109 09/25/2021   CO2 21 (L) 09/25/2021    Lab Results  Component Value Date   ALT 45 (H) 09/25/2021   AST 98 (H) 09/25/2021   ALKPHOS 56 09/25/2021    Lab Results  Component Value Date   INR 1.3 (H) 09/25/2021   BILITOT 1.8 (H) 09/25/2021   ALBUMIN 2.0 (L) 09/25/2021    Imaging:  MR Abd 09/22/21: IMPRESSION: 1. The 3.7 cm mass posteriorly in the right hepatic lobe is highly suspicious for malignancy, with early arterial phase enhancement, central washout, and peripheral capsule appearance. I do not observe definite morphologic findings of cirrhosis, and if the patient does not have special risk factors for cirrhosis, then it is not appropriate to administer a LI-RADS category. However, if the patient did have cirrhosis this would be considered LI-RADS category 5, definite hepatocellular  carcinoma. Given the absence of obvious morphologic findings of cirrhosis, the diagnosis is not as definite and biopsy might therefore be a consideration. This case may benefit from further discussion in multidisciplinary cancer conference   Assessment & Plan:   Problem List Items Addressed This Visit       Unprioritized   Chronic hepatitis C without hepatic coma (HCC)    Timing for treatment to be determined. His daughter requested if we could defer blood draw today since we need to wait anyway given he needed blood transfusion recently in the hospital. This is a reasonable request.   HCV RNA recently > 380,000 copies. Genotype unknown. AFP > 2000.  Diagnostic radiographic findings c/w HCC. Explained that there is theorized benefit in  proceeding with DAA treatment in a hope to prevent HCC recurrence, but all the data to support this is with the use of interferon (which is not used anymore).   Traditionally we have waited to treat HCV after [facility] treatment, however there may be arguable benefit in treating [facility] prior to surgery to improve liver function - given inconclusive evidence of timing here I would like to see what his surgery team feels like regarding this.   Will have him back in 2 months to discuss further with Dr. Luciana Axe after other specialists have had time to see the patient.   Hep B sAg non reactive.      Hepatic steatosis    No official radiographic evidence of cirrhosis. Would be curious to see what pathology shows on liver tissue at time of resection to more definitively assess. Presume this is there underlying, compensated.   Counseled to stop all alcohol intake and tobacco use given upcoming surgery and liver dysfunction.       Hepatocellular carcinoma (HCC)    In care with Dr. Mosetta Putt. Localized disease without metastasis. Considering partial hepatectomy without any adjuvant treatment with pending referral to [facility] team. We are awaiting GI tumor board  discussion and surgery referral at the time of this appointment. Explained to them that there is not a consistent recommendation on timing of DAA to address the HCV eradication in the setting of HCC.   Not a candidate for liver transplantation       Total Encounter Time 60 min: reviewed records from recent hospitalization, outpatient oncology, HCV related imaging and lab studies and in face to face consultation with John Padilla, his family and interpretation services.    Rexene Alberts, MSN, NP-C [facility]  [email or web address] Pager: [phone] Office: [phone] [facility] Main Line: [phone]

## 2021-10-07 NOTE — Assessment & Plan Note (Signed)
Timing for treatment to be determined. His daughter requested if we could defer blood draw today since we need to wait anyway given he needed blood transfusion recently in the hospital. This is a reasonable request.   HCV RNA recently > 380,000 copies. Genotype unknown. AFP > 2000.  Diagnostic radiographic findings c/w HCC. Explained that there is theorized benefit in proceeding with DAA treatment in a hope to prevent [place] recurrence, but all the data to support this is with the use of interferon (which is not used anymore).   Traditionally we have waited to treat HCV after [facility] treatment, however there may be arguable benefit in treating [facility] prior to surgery to improve liver function - given inconclusive evidence of timing here I would like to see what his surgery team feels like regarding this.   Will have him back in 2 months to discuss further with Dr. Linus Salmons after other specialists have had time to see the patient.   Hep B sAg non reactive.

## 2021-10-07 NOTE — Patient Instructions (Signed)
Will have you back in 2 months to check in on the timing of your treatment   Usually we like to wait to start treatment until after you liver cancer treatment.    Please schedule a visit back to see Dr. Linus Salmons in 2 months

## 2021-10-07 NOTE — Assessment & Plan Note (Addendum)
In care with Dr. Burr Medico. Localized disease without metastasis. Considering partial hepatectomy without any adjuvant treatment with pending referral to [facility] Surgery team. We are awaiting GI tumor board discussion and surgery referral at the time of this appointment. Explained to them that there is not a consistent recommendation on timing of DAA to address the HCV eradication in the setting of [place].   Not a candidate for liver transplantation

## 2021-10-07 NOTE — Assessment & Plan Note (Addendum)
No official radiographic evidence of cirrhosis. Would be curious to see what pathology shows on liver tissue at time of resection to more definitively assess. Presume this is there underlying, compensated.   Counseled to stop all alcohol intake and tobacco use given upcoming surgery and liver dysfunction.

## 2021-10-12 ENCOUNTER — Other Ambulatory Visit: Payer: Self-pay

## 2021-10-12 NOTE — Progress Notes (Signed)
The proposed treatment discussed in conference is for discussion purpose only and is not a binding recommendation.  The patients have not been physically examined, or presented with their treatment options.  Therefore, final treatment plans cannot be decided.  

## 2021-10-14 NOTE — Progress Notes (Signed)
I spoke with Mr Brouillard daughter Ubaldo Glassing.  I relayed the recommendations from our GI conference.  I told her we are placing a referral with the surgeons at [facility].  I told her that office will reach out to her to scheduled that consultation.  All questions were answered.  She verbalized understanding.  Referral, demographics, insurance information, and consult note faxed to [facility] [phone]

## 2021-10-26 ENCOUNTER — Other Ambulatory Visit: Payer: Self-pay

## 2021-10-26 DIAGNOSIS — C220 Liver cell carcinoma: Secondary | ICD-10-CM

## 2021-10-27 ENCOUNTER — Inpatient Hospital Stay: Payer: Self-pay

## 2021-10-27 ENCOUNTER — Inpatient Hospital Stay: Payer: Self-pay | Attending: Hematology | Admitting: Hematology

## 2021-10-27 DIAGNOSIS — C220 Liver cell carcinoma: Secondary | ICD-10-CM

## 2021-11-22 NOTE — Progress Notes (Signed)
I spoke with Mr Ben daughter Ubaldo Glassing.  She states that her father is refusing treatment at this time.  She has my name and number and will call me to schedule a follow up appointment if he changes him his mind.

## 2021-12-23 ENCOUNTER — Ambulatory Visit: Payer: Self-pay | Admitting: Internal Medicine

## 2023-12-31 IMAGING — CT CT FEMUR *R* W/O CM
3 series · 9 of 33 positions shown, 10 images · non-contrast
Comparison: None Available.

CLINICAL DATA: 75-year-old male with recent ORIF, with concern for
hemorrhage

EXAM:
CT OF THE LOWER RIGHT EXTREMITY WITHOUT CONTRAST
TECHNIQUE: Multidetector CT imaging of the right lower extremity was performed
according to the standard protocol.
RADIATION DOSE REDUCTION: This exam was performed according to the
departmental dose-optimization program which includes automated
exposure control, adjustment of the mA and/or kV according to
patient size and/or use of iterative reconstruction technique.

[Series 5: lower ext 1.5 st · axial · 0.48mm/px · z∈[+848,+848]mm · 1 of 357 slices shown, 2 images]
[im 192/357  soft-tissue]
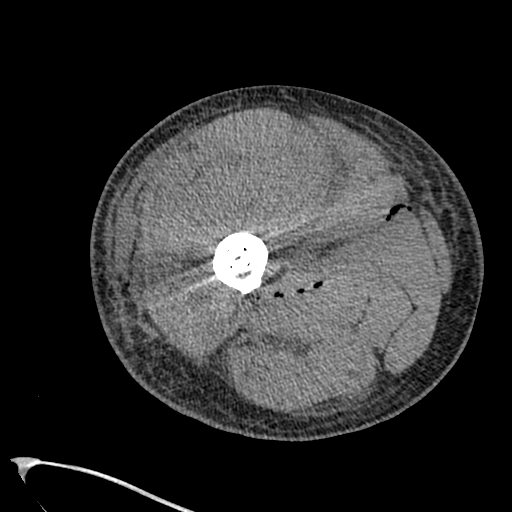
[im 192/357  bone]
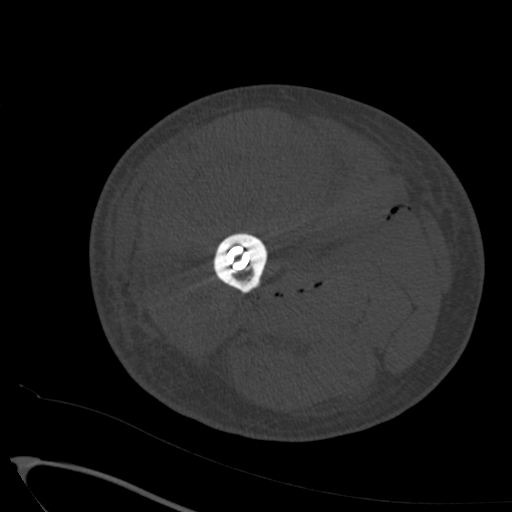

[Series 10: lower ext cor st · coronal · 0.49mm/px · 3 of 164 slices shown]
[im 33/164  bone]
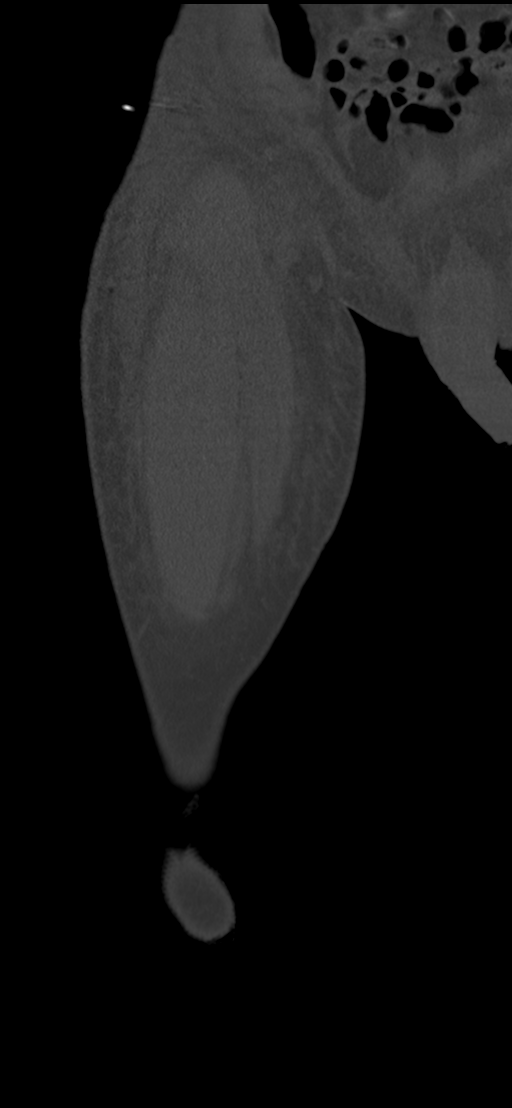
[im 66/164  bone]
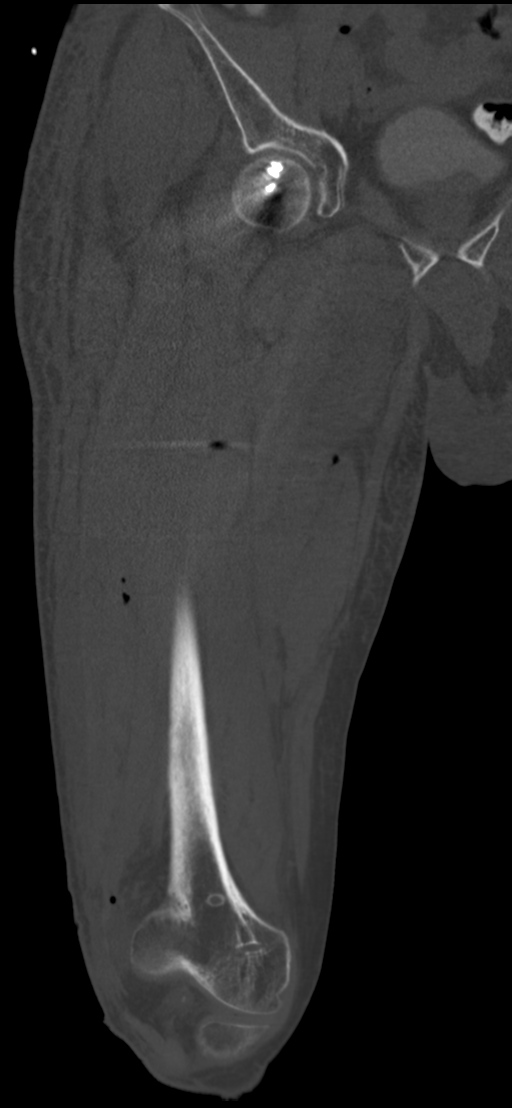
[im 98/164  bone]
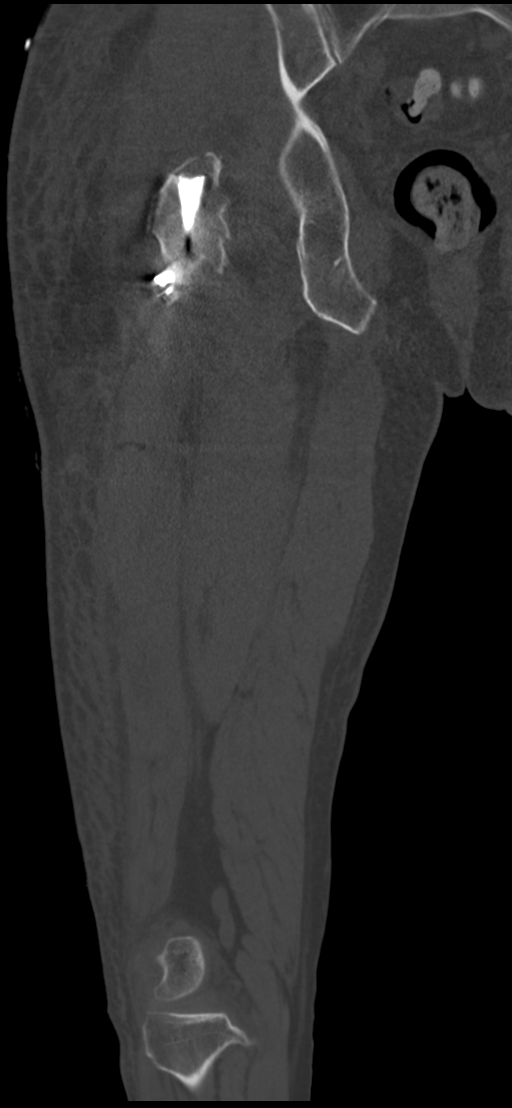

[Series 11: lower ext sag st · sagittal · 0.49mm/px · 5 of 159 slices shown]
[im 53/159  bone]
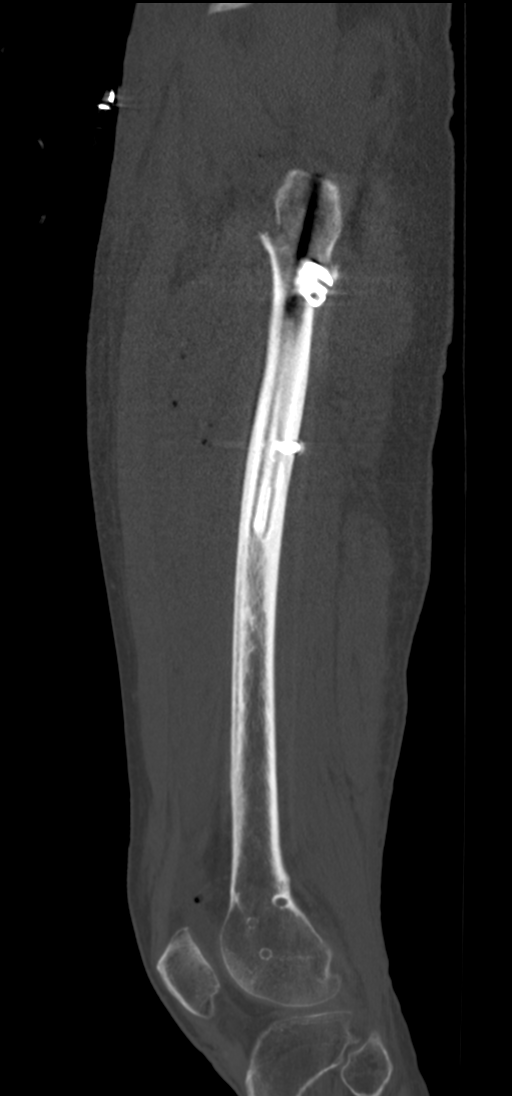
[im 66/159  bone]
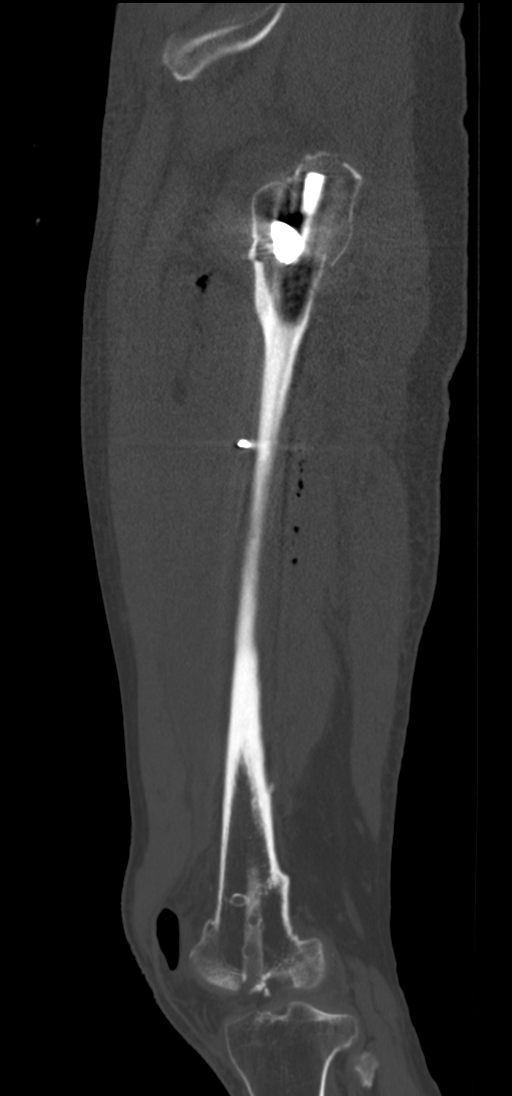
[im 80/159  bone]
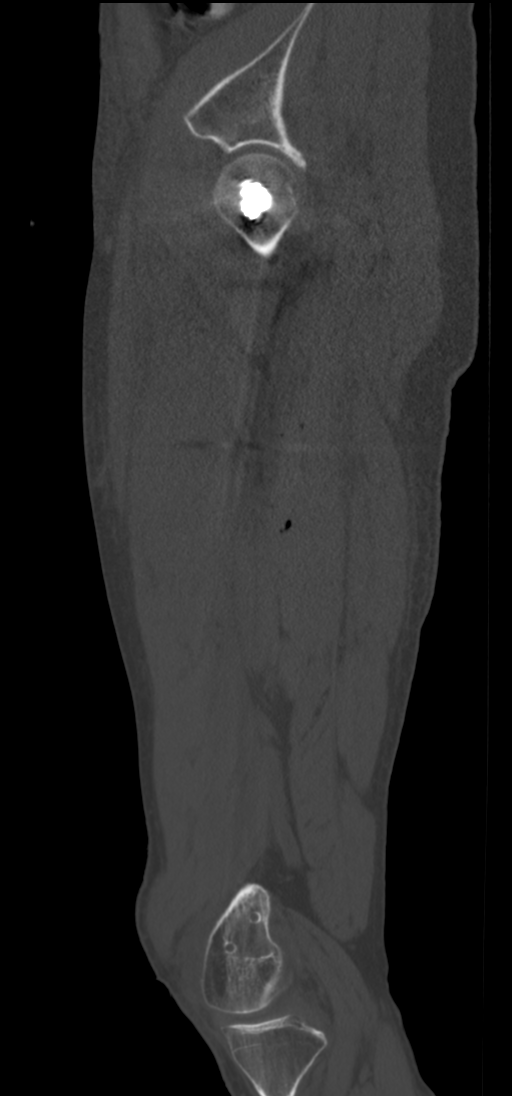
[im 93/159  bone]
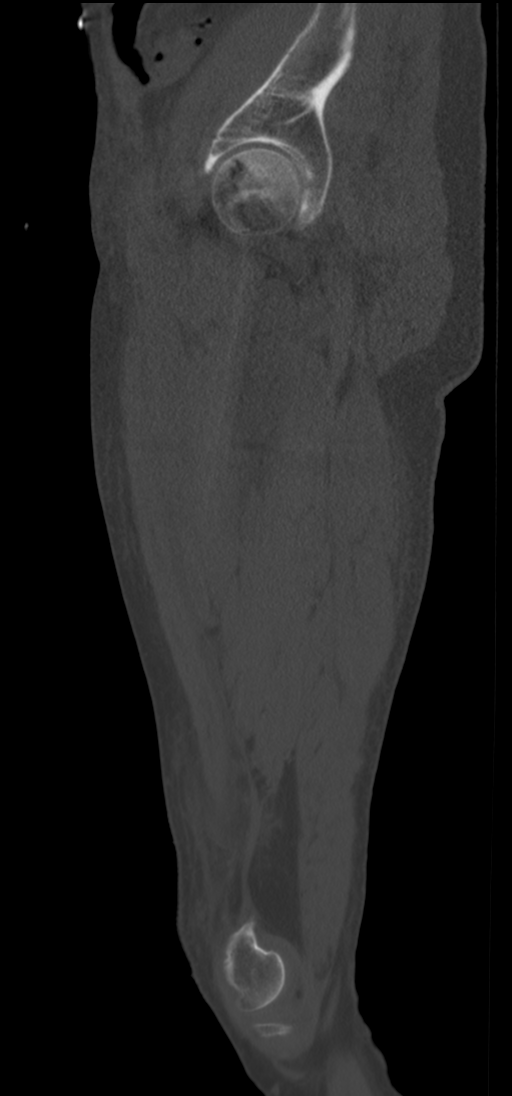
[im 106/159  bone]
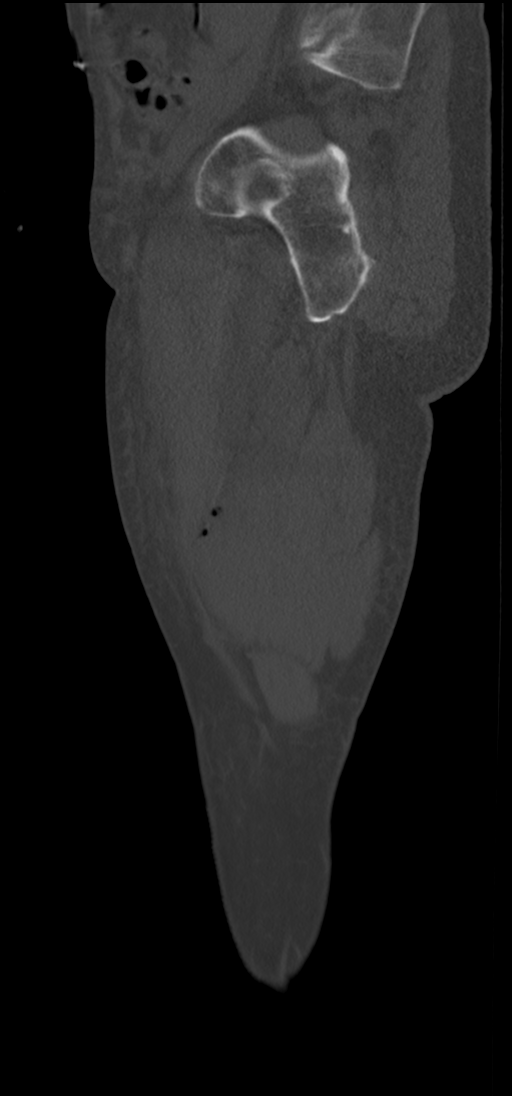

[9 of 33 positions shown; findings below may reference images not displayed]

FINDINGS: Bones/Joint/Cartilage

CT demonstration of comminuted inter trochanteric right hip
fracture.

Recent surgical changes of prior retrograde intramedullary rod
explant and new antegrade intramedullary rod with parallel
cannulated screw fixation of the femoral neck, and single distal
interlocking screw. Relative anatomic alignment of the fracture
fragments at the fracture site.

Right hip is located.

Unremarkable appearance of the visualized pelvis with some motion
artifact. No pelvic fracture identified in the visualized elements.

Fluid and gas within the right knee joint.

Ligaments

Suboptimally assessed by CT.

Muscles and Tendons

Early postsurgical changes within the musculature of the right thigh
with myofacial gas, fluid, and edema.

Soft tissues

Stranding/edema within the superficial soft tissues of the right
thigh. Small focal soft tissue within the posterolateral right hip
measuring 15 mm x 29 mm, compatible with recent surgery. Small focus
within the lateral right thigh in the region of the distal
interlocking screw, compatible with recent surgery. Lentiform fluid
in the lateral right thigh overlying the musculature, compatible
with recent surgery.

No large focal high density fluid collection within the musculature
or the superficial soft tissues of the right thigh.
IMPRESSION: Postoperative changes of retrograde right femoral intramedullary rod
explant, placement of antegrade intramedullary rod with right hip
fixation of comminuted inter trochanteric hip fracture.

Negative for large hematoma of the right hip and right thigh, with
expected early surgical changes of the superficial soft tissues,
musculature, and the right knee joint.

## 2024-02-10 ENCOUNTER — Emergency Department (HOSPITAL_COMMUNITY): Payer: Self-pay

## 2024-02-10 ENCOUNTER — Other Ambulatory Visit: Payer: Self-pay

## 2024-02-10 ENCOUNTER — Encounter (HOSPITAL_COMMUNITY): Payer: Self-pay

## 2024-02-10 ENCOUNTER — Inpatient Hospital Stay (HOSPITAL_COMMUNITY)
Admission: EM | Admit: 2024-02-10 | Discharge: 2024-03-10 | DRG: 871 | Disposition: E | Payer: Self-pay | Attending: Internal Medicine | Admitting: Internal Medicine

## 2024-02-10 DIAGNOSIS — G9341 Metabolic encephalopathy: Secondary | ICD-10-CM | POA: Diagnosis present

## 2024-02-10 DIAGNOSIS — N179 Acute kidney failure, unspecified: Secondary | ICD-10-CM | POA: Diagnosis present

## 2024-02-10 DIAGNOSIS — R64 Cachexia: Secondary | ICD-10-CM | POA: Diagnosis present

## 2024-02-10 DIAGNOSIS — R4182 Altered mental status, unspecified: Secondary | ICD-10-CM | POA: Diagnosis present

## 2024-02-10 DIAGNOSIS — E8809 Other disorders of plasma-protein metabolism, not elsewhere classified: Secondary | ICD-10-CM | POA: Diagnosis present

## 2024-02-10 DIAGNOSIS — E872 Acidosis, unspecified: Secondary | ICD-10-CM | POA: Diagnosis not present

## 2024-02-10 DIAGNOSIS — Z681 Body mass index (BMI) 19 or less, adult: Secondary | ICD-10-CM

## 2024-02-10 DIAGNOSIS — R160 Hepatomegaly, not elsewhere classified: Principal | ICD-10-CM

## 2024-02-10 DIAGNOSIS — K7290 Hepatic failure, unspecified without coma: Secondary | ICD-10-CM | POA: Diagnosis present

## 2024-02-10 DIAGNOSIS — Z7901 Long term (current) use of anticoagulants: Secondary | ICD-10-CM

## 2024-02-10 DIAGNOSIS — E8770 Fluid overload, unspecified: Secondary | ICD-10-CM | POA: Diagnosis present

## 2024-02-10 DIAGNOSIS — D638 Anemia in other chronic diseases classified elsewhere: Secondary | ICD-10-CM | POA: Diagnosis present

## 2024-02-10 DIAGNOSIS — Z515 Encounter for palliative care: Secondary | ICD-10-CM

## 2024-02-10 DIAGNOSIS — Z66 Do not resuscitate: Secondary | ICD-10-CM | POA: Diagnosis present

## 2024-02-10 DIAGNOSIS — F17210 Nicotine dependence, cigarettes, uncomplicated: Secondary | ICD-10-CM | POA: Diagnosis present

## 2024-02-10 DIAGNOSIS — A419 Sepsis, unspecified organism: Principal | ICD-10-CM | POA: Diagnosis present

## 2024-02-10 DIAGNOSIS — E43 Unspecified severe protein-calorie malnutrition: Secondary | ICD-10-CM | POA: Diagnosis present

## 2024-02-10 DIAGNOSIS — C220 Liver cell carcinoma: Secondary | ICD-10-CM | POA: Diagnosis present

## 2024-02-10 DIAGNOSIS — D689 Coagulation defect, unspecified: Secondary | ICD-10-CM | POA: Diagnosis present

## 2024-02-10 DIAGNOSIS — Z96651 Presence of right artificial knee joint: Secondary | ICD-10-CM | POA: Diagnosis present

## 2024-02-10 DIAGNOSIS — E871 Hypo-osmolality and hyponatremia: Secondary | ICD-10-CM | POA: Diagnosis present

## 2024-02-10 DIAGNOSIS — R188 Other ascites: Secondary | ICD-10-CM | POA: Diagnosis present

## 2024-02-10 DIAGNOSIS — B182 Chronic viral hepatitis C: Secondary | ICD-10-CM | POA: Diagnosis present

## 2024-02-10 DIAGNOSIS — R6520 Severe sepsis without septic shock: Secondary | ICD-10-CM | POA: Diagnosis present

## 2024-02-10 DIAGNOSIS — K652 Spontaneous bacterial peritonitis: Secondary | ICD-10-CM | POA: Diagnosis present

## 2024-02-10 DIAGNOSIS — E861 Hypovolemia: Secondary | ICD-10-CM | POA: Diagnosis present

## 2024-02-10 DIAGNOSIS — K8309 Other cholangitis: Secondary | ICD-10-CM | POA: Diagnosis present

## 2024-02-10 LAB — CBC
HCT: 24.2 % — ABNORMAL LOW (ref 39.0–52.0)
Hemoglobin: 8.5 g/dL — ABNORMAL LOW (ref 13.0–17.0)
MCH: 32.9 pg (ref 26.0–34.0)
MCHC: 35.1 g/dL (ref 30.0–36.0)
MCV: 93.8 fL (ref 80.0–100.0)
Platelets: 149 K/uL — ABNORMAL LOW (ref 150–400)
RBC: 2.58 MIL/uL — ABNORMAL LOW (ref 4.22–5.81)
RDW: 18.9 % — ABNORMAL HIGH (ref 11.5–15.5)
WBC: 15.5 K/uL — ABNORMAL HIGH (ref 4.0–10.5)
nRBC: 0 % (ref 0.0–0.2)

## 2024-02-10 LAB — COMPREHENSIVE METABOLIC PANEL WITH GFR
ALT: 161 U/L — ABNORMAL HIGH (ref 0–44)
AST: 842 U/L — ABNORMAL HIGH (ref 15–41)
Albumin: 1.5 g/dL — ABNORMAL LOW (ref 3.5–5.0)
Alkaline Phosphatase: 233 U/L — ABNORMAL HIGH (ref 38–126)
Anion gap: 14 (ref 5–15)
BUN: 39 mg/dL — ABNORMAL HIGH (ref 8–23)
CO2: 16 mmol/L — ABNORMAL LOW (ref 22–32)
Calcium: 8.1 mg/dL — ABNORMAL LOW (ref 8.9–10.3)
Chloride: 97 mmol/L — ABNORMAL LOW (ref 98–111)
Creatinine, Ser: 1.82 mg/dL — ABNORMAL HIGH (ref 0.61–1.24)
GFR, Estimated: 38 mL/min — ABNORMAL LOW (ref >60)
Glucose, Bld: 102 mg/dL — ABNORMAL HIGH (ref 70–99)
Potassium: 4.7 mmol/L (ref 3.5–5.1)
Sodium: 127 mmol/L — ABNORMAL LOW (ref 135–145)
Total Bilirubin: 7.4 mg/dL — ABNORMAL HIGH (ref 0.0–1.2)
Total Protein: 8.7 g/dL — ABNORMAL HIGH (ref 6.5–8.1)

## 2024-02-10 LAB — I-STAT CG4 LACTIC ACID, ED: Lactic Acid, Venous: 5.6 mmol/L (ref 0.5–1.9)

## 2024-02-10 LAB — PROTIME-INR
INR: 1.9 — ABNORMAL HIGH (ref 0.8–1.2)
Prothrombin Time: 22.5 s — ABNORMAL HIGH (ref 11.4–15.2)

## 2024-02-10 LAB — AMMONIA: Ammonia: 24 umol/L (ref 9–35)

## 2024-02-10 MED ORDER — ONDANSETRON HCL 4 MG/2ML IJ SOLN
4.0000 mg | Freq: Once | INTRAMUSCULAR | Status: AC
  Administered 2024-02-10: 4 mg via INTRAVENOUS
  Filled 2024-02-10: qty 2

## 2024-02-10 MED ORDER — SODIUM CHLORIDE 0.9 % IV BOLUS
1000.0000 mL | Freq: Once | INTRAVENOUS | Status: AC
  Administered 2024-02-10: 1000 mL via INTRAVENOUS

## 2024-02-10 MED ORDER — MORPHINE SULFATE (PF) 2 MG/ML IV SOLN
2.0000 mg | Freq: Once | INTRAVENOUS | Status: AC
  Administered 2024-02-10: 2 mg via INTRAVENOUS
  Filled 2024-02-10: qty 1

## 2024-02-10 NOTE — ED Triage Notes (Signed)
 Pt BIB [facility] from home because family reported that pt has had increased lethargy and jaundice x 3 days. Pt has history of Hep C and renal disease per EMS report. Pt speaks Cambodian. Family enroute.

## 2024-02-10 NOTE — ED Notes (Signed)
Pt is currently resting.  

## 2024-02-10 NOTE — ED Notes (Signed)
 Collect labs once pt is in a room.  KM

## 2024-02-10 NOTE — ED Provider Notes (Signed)
 [place] EMERGENCY DEPARTMENT AT [facility] Provider Note   CSN: [id number] Arrival date & time: 02/10/24  2136     Patient presents with: Fatigue   John Padilla is a 78 y.o. male.  {Add pertinent medical, surgical, social history, OB history to HPI:6842} 78 year old male brought in by EMS, daughter and son-in-law at bedside. Patient was diagnosed with hepatocellular carcinoma 2 years ago, followed up but ultimately made the decision not to pursue any treatment.  Patient was living independently.  Daughter has noted gradual decline and then on Wednesday when she went to visit him noticed that he was significantly weak and unable to care for himself and she moved him into her home.  Patient is now refusing any p.o. intake, is unable to ambulate.  He denies pain to his daughter but does tell his wife that he has whole body pain.  Last bowel movement was yesterday, noted to be normal in appearance per daughter.       Prior to Admission medications   Medication Sig Start Date End Date Taking? Authorizing Provider  acetaminophen  (TYLENOL ) 325 MG tablet Take 2 tablets (650 mg total) by mouth every 6 (six) hours as needed for mild pain or moderate pain. 09/26/21   Danton Lauraine LABOR, PA-C  apixaban  (ELIQUIS ) 2.5 MG TABS tablet Take 1 tablet (2.5 mg total) by mouth 2 (two) times daily. 09/26/21 10/26/21  Danton Lauraine LABOR, PA-C  docusate sodium  (COLACE) 100 MG capsule Take 1 capsule (100 mg total) by mouth 2 (two) times daily. 09/27/21   Akula, Vijaya, MD  folic acid  (FOLVITE ) 1 MG tablet Take 1 tablet (1 mg total) by mouth daily. Patient not taking: Reported on 10/07/2021 09/28/21   Akula, Vijaya, MD  melatonin 5 MG TABS Take 1 tablet (5 mg total) by mouth at bedtime as needed. Patient not taking: Reported on 10/07/2021 09/27/21   Akula, Vijaya, MD  methocarbamol  (ROBAXIN ) 500 MG tablet Take 1 tablet (500 mg total) by mouth every 8 (eight) hours as needed for muscle spasms. 09/26/21   Danton Lauraine LABOR, PA-C  Multiple Vitamin (MULTIVITAMIN WITH MINERALS) TABS tablet Take 1 tablet by mouth daily. Patient not taking: Reported on 10/07/2021 09/28/21   Akula, Vijaya, MD  polyethylene glycol (MIRALAX  / GLYCOLAX ) 17 g packet Take 17 g by mouth daily as needed for mild constipation. Patient not taking: Reported on 10/07/2021 09/27/21   Akula, Vijaya, MD  thiamine  100 MG tablet Take 1 tablet (100 mg total) by mouth daily. Patient not taking: Reported on 10/07/2021 09/28/21   Cherlyn Labella, MD  Vitamin D , Cholecalciferol , 25 MCG (1000 UT) TABS Take 1 tablet by mouth daily. Patient not taking: Reported on 10/07/2021 09/26/21   Danton Lauraine LABOR, PA-C    Allergies: Patient has no known allergies.    Review of Systems Level 5 caveat for change in mental status  Updated Vital Signs BP (!) 156/110 (BP Location: Right Arm)   Pulse (!) 101   Temp 98.8 F (37.1 C) (Oral)   Resp 18   SpO2 90%   Physical Exam Vitals and nursing note reviewed.  Constitutional:      General: He is not in acute distress.    Appearance: He is well-developed. He is not diaphoretic.  HENT:     Head: Normocephalic and atraumatic.     Mouth/Throat:     Mouth: Mucous membranes are dry.  Eyes:     General: Scleral icterus present.  Cardiovascular:  Rate and Rhythm: Regular rhythm. Tachycardia present.     Pulses: Normal pulses.     Heart sounds: Normal heart sounds.  Pulmonary:     Effort: Pulmonary effort is normal.     Breath sounds: Normal breath sounds.  Abdominal:     General: There is distension.     Tenderness: There is abdominal tenderness.  Genitourinary:    Penis: Normal.   Musculoskeletal:     Right lower leg: No edema.     Left lower leg: No edema.  Skin:    General: Skin is warm and dry.     Coloration: Skin is jaundiced.     Findings: No erythema or rash.  Neurological:     General: No focal deficit present.     Mental Status: He is alert.  Psychiatric:        Behavior: Behavior normal.      (all labs ordered are listed, but only abnormal results are displayed) Labs Reviewed  CBC - Abnormal; Notable for the following components:      Result Value   WBC 15.5 (*)    RBC 2.58 (*)    Hemoglobin 8.5 (*)    HCT 24.2 (*)    RDW 18.9 (*)    Platelets 149 (*)    All other components within normal limits  I-STAT CG4 LACTIC ACID, ED - Abnormal; Notable for the following components:   Lactic Acid, Venous 5.6 (*)    All other components within normal limits  COMPREHENSIVE METABOLIC PANEL WITH GFR  URINALYSIS, ROUTINE W REFLEX MICROSCOPIC  CBG MONITORING, ED    EKG: None  Radiology: No results found.  {Document cardiac monitor, telemetry assessment procedure when appropriate:32947} Procedures   Medications Ordered in the ED - No data to display    {Click here for ABCD2, HEART and other calculators REFRESH Note before signing:1}                              Medical Decision Making  This patient presents to the ED for concern of ***, this involves an extensive number of treatment options, and is a complaint that carries with it a high risk of complications and morbidity.  The differential diagnosis includes ***   Co morbidities / Chronic conditions that complicate the patient evaluation  ***   Additional history obtained:  Additional history obtained from EMR External records from outside source obtained and reviewed including ***   Lab Tests:  I Ordered, and personally interpreted labs.  The pertinent results include:  ***   Imaging Studies ordered:  I ordered imaging studies including ***  I independently visualized and interpreted imaging which showed *** I agree with the radiologist interpretation   Cardiac Monitoring: / EKG:  The patient was maintained on a cardiac monitor.  I personally viewed and interpreted the cardiac monitored which showed an underlying rhythm of: ***   Problem List / ED Course / Critical interventions / Medication  management  *** I ordered medication including ***   Reevaluation of the patient after these medicines showed that the patient *** I have reviewed the patients home medicines and have made adjustments as needed   Consultations Obtained:  I requested consultation with the ***,  and discussed lab and imaging findings as well as pertinent plan - they recommend: ***   Social Determinants of Health:  ***   Test / Admission - Considered:  ***   {Document critical  care time when appropriate  Document review of labs and clinical decision tools ie CHADS2VASC2, etc  Document your independent review of radiology images and any outside records  Document your discussion with family members, caretakers and with consultants  Document social determinants of health affecting pt's care  Document your decision making why or why not admission, treatments were needed:32947:::1}   Final diagnoses:  None    ED Discharge Orders     None

## 2024-02-11 ENCOUNTER — Inpatient Hospital Stay (HOSPITAL_COMMUNITY)

## 2024-02-11 ENCOUNTER — Emergency Department (HOSPITAL_COMMUNITY)

## 2024-02-11 ENCOUNTER — Emergency Department (HOSPITAL_COMMUNITY): Payer: Self-pay

## 2024-02-11 DIAGNOSIS — A419 Sepsis, unspecified organism: Secondary | ICD-10-CM | POA: Diagnosis present

## 2024-02-11 DIAGNOSIS — E861 Hypovolemia: Secondary | ICD-10-CM | POA: Diagnosis present

## 2024-02-11 DIAGNOSIS — K8309 Other cholangitis: Secondary | ICD-10-CM | POA: Diagnosis present

## 2024-02-11 DIAGNOSIS — K7290 Hepatic failure, unspecified without coma: Secondary | ICD-10-CM | POA: Diagnosis present

## 2024-02-11 DIAGNOSIS — Z515 Encounter for palliative care: Secondary | ICD-10-CM | POA: Diagnosis not present

## 2024-02-11 DIAGNOSIS — E8809 Other disorders of plasma-protein metabolism, not elsewhere classified: Secondary | ICD-10-CM | POA: Diagnosis present

## 2024-02-11 DIAGNOSIS — B182 Chronic viral hepatitis C: Secondary | ICD-10-CM | POA: Diagnosis present

## 2024-02-11 DIAGNOSIS — E43 Unspecified severe protein-calorie malnutrition: Secondary | ICD-10-CM | POA: Diagnosis present

## 2024-02-11 DIAGNOSIS — Z66 Do not resuscitate: Secondary | ICD-10-CM | POA: Diagnosis present

## 2024-02-11 DIAGNOSIS — D638 Anemia in other chronic diseases classified elsewhere: Secondary | ICD-10-CM | POA: Diagnosis present

## 2024-02-11 DIAGNOSIS — D689 Coagulation defect, unspecified: Secondary | ICD-10-CM | POA: Diagnosis present

## 2024-02-11 DIAGNOSIS — R6520 Severe sepsis without septic shock: Secondary | ICD-10-CM | POA: Diagnosis present

## 2024-02-11 DIAGNOSIS — M7989 Other specified soft tissue disorders: Secondary | ICD-10-CM

## 2024-02-11 DIAGNOSIS — E8770 Fluid overload, unspecified: Secondary | ICD-10-CM | POA: Diagnosis present

## 2024-02-11 DIAGNOSIS — C220 Liver cell carcinoma: Secondary | ICD-10-CM | POA: Diagnosis present

## 2024-02-11 DIAGNOSIS — Z681 Body mass index (BMI) 19 or less, adult: Secondary | ICD-10-CM | POA: Diagnosis not present

## 2024-02-11 DIAGNOSIS — E872 Acidosis, unspecified: Secondary | ICD-10-CM | POA: Diagnosis present

## 2024-02-11 DIAGNOSIS — F17210 Nicotine dependence, cigarettes, uncomplicated: Secondary | ICD-10-CM | POA: Diagnosis present

## 2024-02-11 DIAGNOSIS — R188 Other ascites: Secondary | ICD-10-CM | POA: Diagnosis present

## 2024-02-11 DIAGNOSIS — N179 Acute kidney failure, unspecified: Secondary | ICD-10-CM | POA: Diagnosis present

## 2024-02-11 DIAGNOSIS — K652 Spontaneous bacterial peritonitis: Secondary | ICD-10-CM | POA: Diagnosis present

## 2024-02-11 DIAGNOSIS — R4182 Altered mental status, unspecified: Secondary | ICD-10-CM | POA: Diagnosis present

## 2024-02-11 DIAGNOSIS — Z7901 Long term (current) use of anticoagulants: Secondary | ICD-10-CM | POA: Diagnosis not present

## 2024-02-11 DIAGNOSIS — G9341 Metabolic encephalopathy: Secondary | ICD-10-CM | POA: Diagnosis present

## 2024-02-11 DIAGNOSIS — R634 Abnormal weight loss: Secondary | ICD-10-CM

## 2024-02-11 DIAGNOSIS — R64 Cachexia: Secondary | ICD-10-CM | POA: Diagnosis present

## 2024-02-11 DIAGNOSIS — E871 Hypo-osmolality and hyponatremia: Secondary | ICD-10-CM | POA: Diagnosis present

## 2024-02-11 HISTORY — PX: IR PARACENTESIS: IMG2679

## 2024-02-11 LAB — URINALYSIS, W/ REFLEX TO CULTURE (INFECTION SUSPECTED)
Glucose, UA: NEGATIVE mg/dL
Ketones, ur: NEGATIVE mg/dL
Leukocytes,Ua: NEGATIVE
Nitrite: NEGATIVE
Protein, ur: NEGATIVE mg/dL
Specific Gravity, Urine: >1.046 — ABNORMAL HIGH (ref 1.005–1.030)
pH: 5 (ref 5.0–8.0)

## 2024-02-11 LAB — CBC WITH DIFFERENTIAL/PLATELET
Abs Immature Granulocytes: 0.26 K/uL — ABNORMAL HIGH (ref 0.00–0.07)
Basophils Absolute: 0 K/uL (ref 0.0–0.1)
Basophils Relative: 0 %
Eosinophils Absolute: 0 K/uL (ref 0.0–0.5)
Eosinophils Relative: 0 %
HCT: 21.9 % — ABNORMAL LOW (ref 39.0–52.0)
Hemoglobin: 7.4 g/dL — ABNORMAL LOW (ref 13.0–17.0)
Immature Granulocytes: 2 %
Lymphocytes Relative: 15 %
Lymphs Abs: 2.3 K/uL (ref 0.7–4.0)
MCH: 33.6 pg (ref 26.0–34.0)
MCHC: 33.8 g/dL (ref 30.0–36.0)
MCV: 99.5 fL (ref 80.0–100.0)
Monocytes Absolute: 1.3 K/uL — ABNORMAL HIGH (ref 0.1–1.0)
Monocytes Relative: 9 %
Neutro Abs: 11.2 K/uL — ABNORMAL HIGH (ref 1.7–7.7)
Neutrophils Relative %: 74 %
Platelets: 134 K/uL — ABNORMAL LOW (ref 150–400)
RBC: 2.2 MIL/uL — ABNORMAL LOW (ref 4.22–5.81)
RDW: 19.9 % — ABNORMAL HIGH (ref 11.5–15.5)
WBC: 15 K/uL — ABNORMAL HIGH (ref 4.0–10.5)
nRBC: 0 % (ref 0.0–0.2)

## 2024-02-11 LAB — COMPREHENSIVE METABOLIC PANEL WITH GFR
ALT: 149 U/L — ABNORMAL HIGH (ref 0–44)
AST: 836 U/L — ABNORMAL HIGH (ref 15–41)
Albumin: 2 g/dL — ABNORMAL LOW (ref 3.5–5.0)
Alkaline Phosphatase: 224 U/L — ABNORMAL HIGH (ref 38–126)
Anion gap: 14 (ref 5–15)
BUN: 40 mg/dL — ABNORMAL HIGH (ref 8–23)
CO2: 15 mmol/L — ABNORMAL LOW (ref 22–32)
Calcium: 8.1 mg/dL — ABNORMAL LOW (ref 8.9–10.3)
Chloride: 100 mmol/L (ref 98–111)
Creatinine, Ser: 1.73 mg/dL — ABNORMAL HIGH (ref 0.61–1.24)
GFR, Estimated: 40 mL/min — ABNORMAL LOW (ref >60)
Glucose, Bld: 115 mg/dL — ABNORMAL HIGH (ref 70–99)
Potassium: 4.4 mmol/L (ref 3.5–5.1)
Sodium: 129 mmol/L — ABNORMAL LOW (ref 135–145)
Total Bilirubin: 7.7 mg/dL — ABNORMAL HIGH (ref 0.0–1.2)
Total Protein: 8.8 g/dL — ABNORMAL HIGH (ref 6.5–8.1)

## 2024-02-11 LAB — I-STAT CG4 LACTIC ACID, ED: Lactic Acid, Venous: 4.1 mmol/L (ref 0.5–1.9)

## 2024-02-11 LAB — BRAIN NATRIURETIC PEPTIDE: B Natriuretic Peptide: 110.8 pg/mL — ABNORMAL HIGH (ref 0.0–100.0)

## 2024-02-11 LAB — MAGNESIUM: Magnesium: 2 mg/dL (ref 1.7–2.4)

## 2024-02-11 LAB — PHOSPHORUS: Phosphorus: 4.1 mg/dL (ref 2.5–4.6)

## 2024-02-11 LAB — LACTIC ACID, PLASMA: Lactic Acid, Venous: 4 mmol/L (ref 0.5–1.9)

## 2024-02-11 MED ORDER — POLYETHYLENE GLYCOL 3350 17 G PO PACK: 17.0000 g | PACK | Freq: Every day | ORAL | Status: DC | PRN

## 2024-02-11 MED ORDER — PROCHLORPERAZINE EDISYLATE 10 MG/2ML IJ SOLN: 5.0000 mg | Freq: Four times a day (QID) | INTRAMUSCULAR | Status: DC | PRN

## 2024-02-11 MED ORDER — OXYCODONE HCL 5 MG PO TABS: 5.0000 mg | ORAL_TABLET | Freq: Four times a day (QID) | ORAL | Status: DC | PRN

## 2024-02-11 MED ORDER — ALBUMIN HUMAN 25 % IV SOLN
25.0000 g | Freq: Four times a day (QID) | INTRAVENOUS | Status: AC
Start: 2024-02-11 — End: 2024-02-11
  Administered 2024-02-11 (×2): 12.5 g via INTRAVENOUS
  Administered 2024-02-11: 25 g via INTRAVENOUS
  Administered 2024-02-11: 12.5 g via INTRAVENOUS
  Filled 2024-02-11 (×4): qty 100

## 2024-02-11 MED ORDER — GADOBUTROL 1 MMOL/ML IV SOLN
6.0000 mL | Freq: Once | INTRAVENOUS | Status: AC | PRN
  Administered 2024-02-11: 6 mL via INTRAVENOUS

## 2024-02-11 MED ORDER — LIDOCAINE HCL 1 % IJ SOLN
20.0000 mL | Freq: Once | INTRAMUSCULAR | Status: AC
  Administered 2024-02-11: 10 mL via INTRADERMAL

## 2024-02-11 MED ORDER — MORPHINE SULFATE (PF) 2 MG/ML IV SOLN
2.0000 mg | Freq: Once | INTRAVENOUS | Status: AC
  Administered 2024-02-11: 2 mg via INTRAVENOUS
  Filled 2024-02-11: qty 1

## 2024-02-11 MED ORDER — MORPHINE SULFATE (PF) 2 MG/ML IV SOLN
2.0000 mg | INTRAVENOUS | Status: AC | PRN
  Administered 2024-02-11 – 2024-02-12 (×4): 2 mg via INTRAVENOUS
  Filled 2024-02-11 (×4): qty 1

## 2024-02-11 MED ORDER — SODIUM CHLORIDE 0.9 % IV SOLN
2.0000 g | INTRAVENOUS | Status: DC
  Administered 2024-02-11 – 2024-02-15 (×5): 2 g via INTRAVENOUS
  Filled 2024-02-11 (×5): qty 20

## 2024-02-11 MED ORDER — SODIUM CHLORIDE 0.9 % IV SOLN: INTRAVENOUS | Status: DC

## 2024-02-11 MED ORDER — LIDOCAINE HCL 1 % IJ SOLN
INTRAMUSCULAR | Status: AC
  Filled 2024-02-11: qty 20

## 2024-02-11 MED ORDER — METRONIDAZOLE 500 MG/100ML IV SOLN
500.0000 mg | Freq: Two times a day (BID) | INTRAVENOUS | Status: DC
Start: 2024-02-11 — End: 2024-02-16
  Administered 2024-02-11 – 2024-02-15 (×10): 500 mg via INTRAVENOUS
  Filled 2024-02-11 (×10): qty 100

## 2024-02-11 MED ORDER — IOHEXOL 350 MG/ML SOLN
75.0000 mL | Freq: Once | INTRAVENOUS | Status: AC | PRN
  Administered 2024-02-11: 75 mL via INTRAVENOUS

## 2024-02-11 NOTE — ED Notes (Signed)
 Delay in medication administration due to having to obtain ultrasound IV and previous scan times.

## 2024-02-11 NOTE — H&P (Addendum)
 History and Physical  John Padilla FMW:[id number] DOB: [birth year] DOA: 02/10/2024  Referring physician: Beverley Doffing, PA-EDP  PCP: Pcp, No  Outpatient Specialists: Oncology. Patient coming from: Home  Chief Complaint: Lethargy, altered mental status, and generalized weakness.  HPI: John Padilla is a 78 y.o. male with medical history significant for chronic hepatitis C, hepatocellular carcinoma, vitiligo, anemia of chronic disease, ambulatory dysfunction, uses a walker at baseline, who presents to the ER from home due to lethargy, altered mental status, and generalized weakness.  The patient was diagnosed with hepatocellular carcinoma 2 years ago and opted out of treatment.  He lives alone.  Has not seen a medical provider since his diagnosis 2 years ago.  A few days ago, he was noted to have a significant decline in his functional status.  Associated with abdominal distention, poor oral intake, and bilateral lower extremity edema, left greater than right.  His daughter brought him to her home on Wednesday 02/06/24.  However, he has not been eating or drinking adequately.  Today, he only took 2 bites of his meal.  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  CT chest abdomen pelvis with contrast revealed large, markedly complex right hepatic mass with multiple cystic areas, measuring 12.7 x 9.7 cm.  This could reflect abscess or malignancy.  Marked dilation of the intrahepatic and extrahepatic bile ducts with a common bile duct measuring up to 15 mm.  Moderate to large volume ascites throughout the abdomen and pelvis.  Recommend further evaluation of the above findings with MRI abdomen with contrast and MRCP.  The patient received 1 L IV fluid bolus NS x 1, IV  opiate-based analgesics, IV morphine  2 mg x 1, IV antiemetics, IV Zofran  4 mg x 1.  TRH, hospitalist service, was asked to admit.  ED Course: Temperature 98.8.  BP 124/74, pulse 89, respiration rate 18, O2 saturation 97% on room air.  Review of Systems: Review of systems as noted in the HPI. All other systems reviewed and are negative.   Past Medical History:  Diagnosis Date   Chronic hepatitis C without hepatic coma (HCC) 10/07/2021   Hepatocellular carcinoma (HCC) 10/05/2021   Past Surgical History:  Procedure Laterality Date   HARDWARE REMOVAL Right 09/22/2021   Procedure: HARDWARE REMOVAL FEMUR;  Surgeon: Kendal Franky SQUIBB, MD;  Location: [facility] OR;  Service: Orthopedics;  Laterality: Right;   INTRAMEDULLARY (IM) NAIL INTERTROCHANTERIC Right 09/22/2021   Procedure: INTRAMEDULLARY (IM) NAIL INTERTROCHANTRIC;  Surgeon: Kendal Franky SQUIBB, MD;  Location: [facility] OR;  Service: Orthopedics;  Laterality: Right;   JOINT REPLACEMENT     KNEE ARTHROPLASTY Right     Social History:  reports that he has been smoking cigarettes. He has a 20 pack-year smoking history. He has never used smokeless tobacco. He reports current alcohol use of about 2.0 standard drinks of alcohol per week. He reports that he does not use drugs.   No Known Allergies  Family history: None reported.  Prior to Admission medications   Medication Sig Start Date End Date Taking? Authorizing Provider  acetaminophen  (TYLENOL ) 325 MG tablet Take 2 tablets (650 mg total) by mouth every 6 (six) hours as needed for mild pain or moderate pain. 09/26/21   Danton Lauraine LABOR, PA-C  apixaban  (ELIQUIS ) 2.5 MG TABS tablet Take 1 tablet (2.5  mg total) by mouth 2 (two) times daily. 09/26/21 10/26/21  Danton Lauraine LABOR, PA-C  docusate sodium  (COLACE) 100 MG capsule Take 1 capsule (100 mg total) by mouth 2 (two) times daily. 09/27/21   Akula, Vijaya, MD  folic acid  (FOLVITE ) 1 MG tablet Take 1 tablet (1 mg total) by mouth daily. Patient not taking: Reported  on 10/07/2021 09/28/21   Akula, Vijaya, MD  melatonin 5 MG TABS Take 1 tablet (5 mg total) by mouth at bedtime as needed. Patient not taking: Reported on 10/07/2021 09/27/21   Akula, Vijaya, MD  methocarbamol  (ROBAXIN ) 500 MG tablet Take 1 tablet (500 mg total) by mouth every 8 (eight) hours as needed for muscle spasms. 09/26/21   Danton Lauraine LABOR, PA-C  Multiple Vitamin (MULTIVITAMIN WITH MINERALS) TABS tablet Take 1 tablet by mouth daily. Patient not taking: Reported on 10/07/2021 09/28/21   Akula, Vijaya, MD  polyethylene glycol (MIRALAX  / GLYCOLAX ) 17 g packet Take 17 g by mouth daily as needed for mild constipation. Patient not taking: Reported on 10/07/2021 09/27/21   Akula, Vijaya, MD  thiamine  100 MG tablet Take 1 tablet (100 mg total) by mouth daily. Patient not taking: Reported on 10/07/2021 09/28/21   Akula, Vijaya, MD  Vitamin D , Cholecalciferol , 25 MCG (1000 UT) TABS Take 1 tablet by mouth daily. Patient not taking: Reported on 10/07/2021 09/26/21   Danton Lauraine LABOR DEVONNA    Physical Exam: BP 124/74   Pulse 89   Temp 98.8 F (37.1 C) (Oral)   Resp 10   SpO2 96%   General: 78 y.o. year-old male Cachectic in no acute distress.  Somnolent but responsive. Cardiovascular: Regular rate and rhythm with no rubs or gallops.  No thyromegaly or JVD noted.  Bilateral lower extremity edema, left greater than right.  Tenderness with palpation of left lower extremity. Respiratory: Clear to auscultation with no wheezes or rales. Good inspiratory effort. Abdomen: Soft nontender nondistended with normal bowel sounds x4 quadrants. Muskuloskeletal: No cyanosis or clubbing noted bilaterally Neuro: CN II-XII intact, strength, sensation, reflexes Skin: No ulcerative lesions noted or rashes.  Vitiligo. Psychiatry: Judgment and mood unable to assess due to somnolence.         Labs on Admission:  Basic Metabolic Panel: Recent Labs  Lab 02/10/24 2221  NA 127*  K 4.7  CL 97*  CO2 16*  GLUCOSE 102*  BUN  39*  CREATININE 1.82*  CALCIUM 8.1*   Liver Function Tests: Recent Labs  Lab 02/10/24 2221  AST 842*  ALT 161*  ALKPHOS 233*  BILITOT 7.4*  PROT 8.7*  ALBUMIN 1.5*   No results for input(s): LIPASE, AMYLASE in the last 168 hours. Recent Labs  Lab 02/10/24 2316  AMMONIA 24   CBC: Recent Labs  Lab 02/10/24 2221  WBC 15.5*  HGB 8.5*  HCT 24.2*  MCV 93.8  PLT 149*   Cardiac Enzymes: No results for input(s): CKTOTAL, CKMB, CKMBINDEX, TROPONINI in the last 168 hours.  BNP (last 3 results) No results for input(s): BNP in the last 8760 hours.  ProBNP (last 3 results) No results for input(s): PROBNP in the last 8760 hours.  CBG: No results for input(s): GLUCAP in the last 168 hours.  Radiological Exams on Admission: CT Head Wo Contrast Result Date: 02/11/2024 CLINICAL DATA:  Initial evaluation for acute mental status change, unknown cause. EXAM: CT HEAD WITHOUT CONTRAST TECHNIQUE: Contiguous axial images were obtained from the base of the skull through the vertex without intravenous contrast. RADIATION DOSE  REDUCTION: This exam was performed according to the departmental dose-optimization program which includes automated exposure control, adjustment of the mA and/or kV according to patient size and/or use of iterative reconstruction technique. COMPARISON:  CT from 09/22/2021. FINDINGS: Brain: Age-related cerebral atrophy with moderate to advanced chronic microvascular ischemic disease. Remote lacunar infarct present at the right basal ganglia. Symmetric calcification/mineralization noted within the bilateral globus palladi. Small remote right cerebellar infarct. No acute intracranial hemorrhage. No acute large vessel territory infarct. No mass lesion or midline shift. No hydrocephalus or extra-axial fluid collection. No acute Vascular: No abnormal hyperdense vessel. Calcified atherosclerosis present at skull base. Skull: Scalp soft tissues within normal limits.   Calvarium intact. Sinuses/Orbits: Globes orbital soft tissues within normal limits. Paranasal sinuses are largely clear. No significant mastoid effusion. Other: None. IMPRESSION: 1. No acute intracranial abnormality. 2. Age-related cerebral atrophy with moderate to advanced chronic microvascular ischemic disease, with small remote right cerebellar and right basal ganglia infarcts. Electronically Signed   By: Morene Hoard M.D.   On: 02/11/2024 01:30   CT CHEST ABDOMEN PELVIS W CONTRAST Result Date: 02/11/2024 EXAM: CT CHEST, ABDOMEN AND PELVIS WITH CONTRAST 02/11/2024 12:57:41 AM TECHNIQUE: CT of the chest, abdomen and pelvis was performed with the administration of 75 mL iohexol  (OMNIPAQUE ) 350 MG/ML injection. Multiplanar reformatted images are provided for review. Automated exposure control, iterative reconstruction, and/or weight based adjustment of the mA/kV was utilized to reduce the radiation dose to as low as reasonably achievable. COMPARISON: 09/23/2021 CLINICAL HISTORY: Sepsis. FINDINGS: CHEST: MEDIASTINUM AND LYMPH NODES: Heart and pericardium are unremarkable. The central airways are clear. No mediastinal, hilar or axillary lymphadenopathy. LUNGS AND PLEURA: Bibasilar atelectasis. No pleural effusion or pneumothorax. ABDOMEN AND PELVIS: LIVER: Large, markedly complex mass or process in the right lobe of the liver with multiple cystic areas overall abnormal area measures 12.7 x 9.7 cm in the right hepatic lobe. GALLBLADDER AND BILE DUCTS: Markedly dilated intrahepatic and extrahepatic biliary ducts. Common bile duct measures up to 15 mm. SPLEEN: No acute abnormality. PANCREAS: No acute abnormality. ADRENAL GLANDS: No acute abnormality. KIDNEYS, URETERS AND BLADDER: 1.4 cm cyst in the mid pole of the left kidney appears benign. Per consensus, no follow-up is needed for simple Bosniak type 1 and 2 renal cysts, unless the patient has a malignancy history or risk factors. No stones in the kidneys or  ureters. No hydronephrosis. No perinephric or periureteral stranding. Urinary bladder is unremarkable. GI AND BOWEL: Stomach demonstrates no acute abnormality. There is no bowel obstruction. REPRODUCTIVE ORGANS: No acute abnormality. PERITONEUM AND RETROPERITONEUM: Large volume ascites throughout the abdomen and pelvis. No free air. VASCULATURE: Diffuse aortic atherosclerosis. ABDOMINAL AND PELVIS LYMPH NODES: No lymphadenopathy. BONES AND SOFT TISSUES: No acute osseous abnormality. No focal soft tissue abnormality. IMPRESSION: 1. Large, markedly complex right hepatic mass with multiple cystic areas, measuring 12.7 x 9.7 cm. This could reflect abscess or malignancy. 2. Marked dilation of the intrahepatic and extrahepatic bile ducts, with the common bile duct measuring up to 15 mm. 3. Moderate-to-large volume ascites throughout the abdomen and pelvis. 4. Recommend further evaluation of the above findings with MRI abdomen with contrast and MRCP. Electronically signed by: Franky Crease MD 02/11/2024 01:28 AM EST RP Workstation: [id number]   DG Chest Port 1 View Result Date: 02/11/2024 EXAM: 1 VIEW(S) XRAY OF THE CHEST 02/11/2024 12:20:00 AM COMPARISON: 09/21/2021 CLINICAL HISTORY: weakness weakness FINDINGS: LUNGS AND PLEURA: Linear densities in the lung bases are compatible with atelectasis. No pulmonary edema. No pleural effusion.  No pneumothorax. HEART AND MEDIASTINUM: Aortic atherosclerosis. BONES AND SOFT TISSUES: No acute osseous abnormality. IMPRESSION: 1. No acute cardiopulmonary findings. 2. Bibasilar atelectasis. Electronically signed by: Franky Crease MD 02/11/2024 12:40 AM EST RP Workstation: [id number]    EKG: I independently viewed the EKG done and my findings are as followed: Sinus rhythm, rate of 84.  Nonspecific ST-T changes.  QTc 454.  Assessment/Plan Present on Admission:  AMS (altered mental status)  Principal Problem:   AMS (altered mental status)  Acute metabolic encephalopathy, suspect  multifactorial Secondary to sepsis versus malignancy versus others Continue to treat underlying conditions Reorient as needed. Fall precautions Aspiration precautions. CT head was non-acute Ammonia level within normal limits, 24.  Sepsis secondary to suspected intra-abdominal infection, possible acute cholangitis/possible SBP, POA Leukocytosis 15K, lactic acidosis 5.6, tachycardia, large ascites, common bile duct dilatation, jaundice, elevated LFTs. Follow MRCP with and without contrast. IR consulted for possible diagnostic and therapeutic paracentesis Continue broad-spectrum IV antibiotics, Rocephin and IV Flagyl. Follow peripheral blood cultures x 2 Follow UA and urine culture Monitor fever curve and WBCs Maintain MAP greater than 65 IV albumin Add NS at 75 cc/h x 1 day, judiciously, to prevent volume overload and compromise respiratory status.  Chronic hepatitis C, hepatocellular carcinoma CT chest abdomen pelvis with contrast revealed large, markedly complex right hepatic mass with multiple cystic areas, measuring 12.7 x 9.7 cm.  This could reflect abscess or malignancy.  Marked dilation of the intrahepatic and extrahepatic bile ducts with a common bile duct measuring up to 15 mm.  Moderate to large volume ascites throughout the abdomen and pelvis.  Recommend further evaluation of the above findings with MRI abdomen with contrast and MRCP. Has opted out of his treatment, 2 years ago. Was previously seen by Dr. Lanny  Large ascites secondary to the above IR consulted for diagnostic and therapeutic paracentesis. IV albumin 25 g every 6 hours x 4 Start IV Rocephin and IV Flagyl until SBP is ruled out.  Transaminitis in the setting of hepatocellular carcinoma and possible biliary obstruction Alkaline phosphatase, AST and ALT elevated, T. bili 7.4, common bile duct dilatation. Continue to avoid hepatotoxic agents and follow MRCP. Trend LFTs.  Bilateral lower extremity edema, left  greater than right, POA In the setting of hypoalbuminemia However significant pain when squeezing left calf. Rule out DVT Follow-up duplex ultrasound of lower extremities.  Hypervolemic hyponatremia Serum sodium 127 IV albumin Repeat labs  Hypoalbuminemia Albumin 1.5 From liver failure, in the setting of hepatocellular carcinoma.  AKI, likely multifactorial BUN 39, creatinine 1.82 At baseline creatinine 0.9 with GFR greater than 60. Continue IV albumin 25 g every 6 hours x 4. Add NS at 75 cc/h x 1 day, judiciously, to prevent volume overload and compromise respiratory status. Monitor urine output.  Non anion gap metabolic acidosis in the setting of lactic acidosis Serum bicarb 16, anion gap 14 Continue to treat underlying conditions Repeat chemistry panel in the morning  Lactic acidosis Lactic acid 5.6, 4.1, improved after IV fluid hydration. Trend lactic acid. Judicious IV fluid to prevent volume overload and compromise.  Status. Add NS at 75 cc/h x 1 day.  Coagulopathy INR 1.9, PTT 22.5 Hold off anticoagulant Continue to monitor  Anemia of chronic disease Hemoglobin 8.5 No reported overt bleeding Continue to monitor H&H  Severe protein calorie malnutrition Albumin 1.5 Severe muscle mass loss Dietitian consulted.  Goals of care discussion Currently DNR/DNI, per his daughter at bedside.  The patient has opted out of treatment for  his hepatitis C and hepatocellular carcinoma. Palliative care medicine consulted to assist with establishing goals of care.  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    Critical care time: 70 minutes.     DVT prophylaxis: SCDs.  Code Status: DNR/DNI.  Family Communication: Daughter at bedside John Padilla [phone].   Please update daughter.  Can also reach her husband John Padilla at [phone] for updates if unable to reach the patient's daughter.  Disposition Plan: Admitted to progressive care unit.  Consults called: Palliative care medicine.  Admission status: Inpatient status.   Status is: Inpatient The patient requires at least 2 midnights for further evaluation and treatment of present condition.   John LOISE Hurst MD [facility] Pager [phone]  If 7PM-7AM, please contact night-coverage [email or web address] Password [email or web address]  02/11/2024, 2:26 AM

## 2024-02-11 NOTE — ED Notes (Signed)
 US  doppler of LE at [facility], in progress. Concurrent IV access attempt. Pt awake, calm, cooperative, tolerating well, intermittent moans of pain with general body manipulation or palpation. Family x3 at [facility].

## 2024-02-11 NOTE — Care Plan (Signed)
 This 78 yrs old Male with PMH significant for chronic hepatitis C, hepatocellular carcinoma, vitiligo, anemia of chronic disease, ambulatory dysfunction, uses walker at baseline, who presents to the ER from home due to lethargy, altered mental status, and generalized weakness.He was diagnosed with hepatocellular carcinoma 2 years ago and opted out of treatment. He lives alone.  He has not seen a medical provider since his diagnosis 2 years ago. A few days ago, he was noted to have a significant decline in his functional status.  Patient developed abdominal distention, poor oral intake, bilateral lower extremity edema.  His daughter brought him to stay with her however he has not been eating or drinking adequately. He is found to have lactic acidosis, leukocytosis, tachycardia, CT head no acute intracranial abnormality.  CT chest abdomen pelvis revealed large markedly complex right hepatic mass with multiple cystic areas this could reflect abscess or malignancy.  Marked dilatation of intra and extrahepatic bile duct with the common bile duct measuring up to 15 mm.  Moderate to large volume ascites throughout the abdomen and pelvis.  Patient was admitted for further evaluation.  Gastroenterology is consulted.  MRCP report is pending.  Palliative care is also consulted to discuss goals of care.

## 2024-02-11 NOTE — ED Notes (Signed)
 PT pulled off his condom catheter and was noted to be covered in urine. Linens were cleaned and male purewick was placed. PT also removed IV in left AC.   PT's urine is noted to be amber in appearance w/o a foul smell noted.   PT is alert to voice. PT's daughter is translating at the bedside for staff. PT is restless at the time and family requested that morphine  be administered for his pain.

## 2024-02-11 NOTE — Procedures (Signed)
 Patient presents for  therapeutic and diagnostic paracentesis. US  limited abdomen shows trace amount of pertioneal fluid noted Attempt to perform paracentesis unsuccessful. Procedure aborted.   Patient interviewed with the assistance of tablet translator service.

## 2024-02-11 NOTE — ED Notes (Addendum)
 Report given to Duwayne Heck, RN.

## 2024-02-11 NOTE — ED Notes (Signed)
 Pt remains in MRI

## 2024-02-11 NOTE — Progress Notes (Signed)
 Bilateral lower extremity venous duplex has been completed. Preliminary results can be found in CV Proc through chart review.   02/11/24 12:02 PM Cathlyn Collet RVT

## 2024-02-11 NOTE — ED Notes (Signed)
 Patient transported to CT

## 2024-02-11 NOTE — Consult Note (Signed)
 Consultation Note Date: 02/11/2024   Patient Name: John Padilla  DOB: Sep 21, 1945  MRN: [id number]  Age / Sex: 78 y.o., male  PCP: Pcp, No Referring Physician: Leotis Bogus, MD  Reason for Consultation: Establishing goals of care  HPI/Patient Profile: 78 y.o. male   admitted on 02/10/2024 with   PMH significant for chronic hepatitis C, hepatocellular carcinoma, vitiligo, anemia of chronic disease, ambulatory dysfunction, uses walker at baseline, who presents to the ER from home due to lethargy, altered mental status, and generalized weakness.He was diagnosed with hepatocellular carcinoma 2 years ago and opted out of treatment. He lives alone.   He has not seen a medical provider since his diagnosis 2 years ago. A few days ago, he was noted to have a significant decline in his functional status.  Patient developed abdominal distention, poor oral intake, bilateral lower extremity edema.  His daughter brought him to stay with her however he has not been eating or drinking adequately. He is found to have lactic acidosis, leukocytosis, tachycardia, CT head no acute intracranial abnormality.  CT chest abdomen pelvis revealed large markedly complex right hepatic mass with multiple cystic areas this could reflect abscess or malignancy.  Marked dilatation of intra and extrahepatic bile duct with the common bile duct measuring up to 15 mm.  Moderate to large volume ascites throughout the abdomen and pelvis.  Patient was admitted for further evaluation.  Gastroenterology is consulted.  MRCP report is pending.    Palliative care  consulted to discuss goals of care.  Patient and family face treatment option decision, advanced directive decisions and anticipatory care needs.    Clinical Assessment and Goals of Care:  This NP Ronal Plants reviewed medical records, received report from team, assessed the patient and then meet at  the patient's bedside along with his wife/ John Padilla  and daughter/ John Padilla    to discuss diagnosis, prognosis, GOC, EOL wishes disposition and options.   Concept of Palliative Care was introduced as specialized medical care for people and their families living with serious illness.  If focuses on providing relief from the symptoms and stress of a serious illness.  The goal is to improve quality of life for both the patient and the family.Values and goals of care important to patient and family were attempted to be elicited.   Narrative & Impression  EXAM: MRCP WITH AND WITHOUT IV CONTRAST 02/11/2024 08:54:00 AM   TECHNIQUE: Multisequence, multiplanar magnetic resonance images of the abdomen with and without intravenous contrast. MRCP sequences were performed. 6 mL gadobutrol  (GADAVIST ) 1 MMOL/ML injection 6 mL GADOBUTROL  1 MMOL/ML IV SOLN was administered.   COMPARISON: CT 02/11/2024 and MRI 09/23/2021.   CLINICAL HISTORY: Biliary obstruction suspected (Ped 0-17y); 824029 Biliary obstruction (HCC) [id number].   FINDINGS:   LIVER: Interval progression of hepatic mass within the right hepatic lobe compared to MRI 2023. The majority of the right hepatic lobe is involved by a confluent infiltrate 11.5 x 10.9 cm mass on image 18 of series 3. There is extension  into the left hepatic lobe on the same image. There is a tumor thrombus within the left and right portal veins and extending to the main portal vein. The main portal vein is expanded to 2.9 cm on image 20/series 3 consistent with tumor thrombus.   GALLBLADDER AND BILIARY SYSTEM: The gallbladder is normal. There is no intrahepatic biliary ductal dilatation or extrahepatic biliary ductal dilatation.   SPLEEN: Unremarkable.   PANCREAS/PANCREATIC DUCT: The pancreas is grossly normal. Pancreatic ductal dilatation.   ADRENAL GLANDS: Unremarkable.   KIDNEYS: Benign nonenhancing cyst of the left kidney measures 15 mm.    LYMPH NODES: No enlarged abdominal lymph nodes.   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   PERITONEUM: Large volume of ascites surrounds the liver and spleen.   ABDOMINAL WALL: No hernia. No mass.   BOWEL: Grossly unremarkable. No bowel obstruction.   BONES: No acute abnormality or worrisome osseous lesion.   SOFT TISSUES: Unremarkable.   MISCELLANEOUS: Unremarkable.   IMPRESSION: 1. Interval progression of large right hepatic lobe mass with extension into the left hepatic lobe, compatible with infiltrative malignancy. Finding consistent with hepatocellular carcinoma . 2. 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 Complete occlusion of portal system. 3. Large-volume ascites. 4. No intrahepatic or extrahepatic biliary ductal dilatation.      Created space and opportunity for patient  and family to explore thoughts and feelings regarding current medical situation     A  discussion was had today regarding advanced directives.  Concepts specific to code status, artifical feeding and hydration, continued IV antibiotics and rehospitalization was had.    The difference between a aggressive medical intervention path  and a palliative comfort care path for this patient at this time was had.    MOST form introduced and a Hard wishes booklet left for review   Natural trajectory and expectations at EOL were discussed.  Questions and concerns addressed.  Patient  encouraged to call with questions or concerns.     PMT will continue to support holistically.         No documented HPOA or ACP documents noted.  Daughter declines interpretor, she communicates  she is the only child and both her partents defer all decicsions to her        SUMMARY OF RECOMMENDATIONS     Code Status/Advance Care Planning: Limited code   Symptom Management:  Per attending   Palliative Prophylaxis:  Aspiration, Bowel Regimen, Delirium Protocol, and Frequent Pain Assessment  Additional Recommendations (Limitations, Scope, Preferences): Full Scope Treatment  Psycho-social/Spiritual:  Desire for further Chaplaincy support:no Additional Recommendations: Education on Hospice  Prognosis:  Unable to determine  Discharge Planning: To Be Determined      Primary Diagnoses: Present on Admission:  AMS (altered mental status)   I have reviewed the medical record, interviewed the patient and family, and examined the patient. The following aspects are pertinent.  Past Medical History:  Diagnosis Date   Chronic hepatitis C without hepatic coma (HCC) 10/07/2021   Hepatocellular carcinoma (HCC) 10/05/2021   Social History   Socioeconomic History   Marital status: Married    Spouse name: Not on file   Number of children: 1   Years of education: Not on file   Highest education level: Not on file  Occupational History  Not on file  Tobacco Use   Smoking status: Every Day    Current packs/day: 0.50    Average packs/day: 0.5 packs/day for 40.0 years (20.0 ttl pk-yrs)    Types: Cigarettes   Smokeless tobacco: Never  Vaping Use   Vaping status: Never Used  Substance and Sexual Activity   Alcohol use: Yes    Alcohol/week: 2.0 standard drinks of alcohol    Types: 2 Cans of beer per week    Comment: Daily for 40 years   Drug use: Never   Sexual activity: Not Currently  Other Topics Concern   Not on file  Social History Narrative   Retired merchandiser, retail at [employer]    Social Drivers of Health   Financial Resource Strain: Not on file  Food Insecurity: Not on file  Transportation Needs: Not on file  Physical Activity: Not on file  Stress: Not on file  Social Connections: Not on file   History reviewed. No pertinent family history. Scheduled  Meds: Continuous Infusions:  sodium chloride      albumin human Stopped (02/11/24 9394)   cefTRIAXone (ROCEPHIN)  IV Stopped (02/11/24 0257)   metronidazole Stopped (02/11/24 0437)   PRN Meds:.morphine  injection, oxyCODONE , polyethylene glycol, prochlorperazine Medications Prior to Admission:  Prior to Admission medications   Medication Sig Start Date End Date Taking? Authorizing Provider  acetaminophen  (TYLENOL ) 500 MG tablet Take 1,000 mg by mouth every 6 (six) hours as needed for mild pain (pain score 1-3) or fever.   Yes [provider]   No Known Allergies Review of Systems  Neurological:  Positive for weakness.    Physical Exam Constitutional:      Appearance: He is cachectic. He is ill-appearing.  Cardiovascular:     Rate and Rhythm: Normal rate.  Pulmonary:     Effort: Tachypnea present.  Skin:    General: Skin is warm and dry.  Neurological:     Mental Status: He is alert.     Vital Signs: BP 138/79 (BP Location: Right Arm)   Pulse (!) 114   Temp 97.8 F (36.6 C) (Oral)   Resp 14   Ht 5' 2 (1.575 m)   Wt 45.4 kg   SpO2 98%   BMI 18.29 kg/m  Pain Scale: Faces       SpO2: SpO2: 98 % O2 Device:SpO2: 98 % O2 Flow Rate: .   IO: Intake/output summary:  Intake/Output Summary (Last 24 hours) at 02/11/2024 1155 Last data filed at 02/11/2024 1049 Gross per 24 hour  Intake --  Output 25 ml  Net -25 ml    LBM:   Baseline Weight: Weight: 45.4 kg Most recent weight: Weight: 45.4 kg     Palliative Assessment/Data:  30 %      Time: 75 minutes   Signed by: Ronal Plants, NP   Please contact Palliative Medicine Team phone at [phone] for questions and concerns.  For individual provider: See Tracey

## 2024-02-11 NOTE — ED Notes (Signed)
 Abx were initiated. Family is at bedside. PT is resting comfortably.

## 2024-02-12 DIAGNOSIS — C220 Liver cell carcinoma: Secondary | ICD-10-CM

## 2024-02-12 DIAGNOSIS — R627 Adult failure to thrive: Secondary | ICD-10-CM

## 2024-02-12 DIAGNOSIS — R531 Weakness: Secondary | ICD-10-CM

## 2024-02-12 LAB — CBC
HCT: 19 % — ABNORMAL LOW (ref 39.0–52.0)
Hemoglobin: 6.7 g/dL — CL (ref 13.0–17.0)
MCH: 34 pg (ref 26.0–34.0)
MCHC: 35.3 g/dL (ref 30.0–36.0)
MCV: 96.4 fL (ref 80.0–100.0)
Platelets: 140 K/uL — ABNORMAL LOW (ref 150–400)
RBC: 1.97 MIL/uL — ABNORMAL LOW (ref 4.22–5.81)
RDW: 20 % — ABNORMAL HIGH (ref 11.5–15.5)
WBC: 15.5 K/uL — ABNORMAL HIGH (ref 4.0–10.5)
nRBC: 0.5 % — ABNORMAL HIGH (ref 0.0–0.2)

## 2024-02-12 LAB — COMPREHENSIVE METABOLIC PANEL WITH GFR
ALT: 135 U/L — ABNORMAL HIGH (ref 0–44)
AST: 763 U/L — ABNORMAL HIGH (ref 15–41)
Albumin: 2 g/dL — ABNORMAL LOW (ref 3.5–5.0)
Alkaline Phosphatase: 212 U/L — ABNORMAL HIGH (ref 38–126)
Anion gap: 13 (ref 5–15)
BUN: 48 mg/dL — ABNORMAL HIGH (ref 8–23)
CO2: 17 mmol/L — ABNORMAL LOW (ref 22–32)
Calcium: 8.1 mg/dL — ABNORMAL LOW (ref 8.9–10.3)
Chloride: 100 mmol/L (ref 98–111)
Creatinine, Ser: 1.68 mg/dL — ABNORMAL HIGH (ref 0.61–1.24)
GFR, Estimated: 42 mL/min — ABNORMAL LOW (ref >60)
Glucose, Bld: 95 mg/dL (ref 70–99)
Potassium: 4 mmol/L (ref 3.5–5.1)
Sodium: 130 mmol/L — ABNORMAL LOW (ref 135–145)
Total Bilirubin: 7.4 mg/dL — ABNORMAL HIGH (ref 0.0–1.2)
Total Protein: 8.7 g/dL — ABNORMAL HIGH (ref 6.5–8.1)

## 2024-02-12 LAB — PROCALCITONIN: Procalcitonin: 1.74 ng/mL

## 2024-02-12 LAB — SODIUM, URINE, RANDOM: Sodium, Ur: <30 mmol/L

## 2024-02-12 LAB — PROTIME-INR
INR: 2.1 — ABNORMAL HIGH (ref 0.8–1.2)
Prothrombin Time: 24.3 s — ABNORMAL HIGH (ref 11.4–15.2)

## 2024-02-12 LAB — MAGNESIUM: Magnesium: 1.9 mg/dL (ref 1.7–2.4)

## 2024-02-12 LAB — TYPE AND SCREEN
ABO/RH(D): O POS
Antibody Screen: NEGATIVE

## 2024-02-12 LAB — CREATININE, URINE, RANDOM: Creatinine, Urine: 186 mg/dL

## 2024-02-12 LAB — MRSA NEXT GEN BY PCR, NASAL: MRSA by PCR Next Gen: NOT DETECTED

## 2024-02-12 LAB — AMMONIA: Ammonia: 28 umol/L (ref 9–35)

## 2024-02-12 LAB — OSMOLALITY: Osmolality: 299 mosm/kg — ABNORMAL HIGH (ref 275–295)

## 2024-02-12 LAB — PHOSPHORUS: Phosphorus: 3.7 mg/dL (ref 2.5–4.6)

## 2024-02-12 LAB — URIC ACID: Uric Acid, Serum: 8.2 mg/dL (ref 3.7–8.6)

## 2024-02-12 LAB — OSMOLALITY, URINE: Osmolality, Ur: 599 mosm/kg (ref 300–900)

## 2024-02-12 LAB — C-REACTIVE PROTEIN: CRP: 8.2 mg/dL — ABNORMAL HIGH (ref <1.0)

## 2024-02-12 MED ORDER — HYDROMORPHONE HCL 1 MG/ML IJ SOLN
1.0000 mg | INTRAMUSCULAR | Status: DC | PRN
  Administered 2024-02-12 – 2024-02-13 (×4): 1 mg via INTRAVENOUS
  Filled 2024-02-12 (×5): qty 1

## 2024-02-12 NOTE — Evaluation (Signed)
 Physical Therapy Evaluation Patient Details Name: John Padilla MRN: [id number] DOB: 1945/12/31 Today's Date: 02/12/2024  History of Present Illness  John Padilla is a 78 y.o male admitted 02/10/24 for AMS. He presented with lethargy, generalized weakness, abdominal distention, poor oral intake, and bilateral lower extremity edema (L>R). CT chest abdomen pelvis with contrast revealed large, markedly complex right hepatic mass with multiple cystic areas, measuring 12.7 x 9.7 cm; marked dilation of the intrahepatic and extrahepatic bile ductsl moderate to large volume ascites throughout the abdomen and pelvis. IR with unsuccessful ultrasound-guided paracentesis. PMHx: hronic hepatitis C, hepatocellular carcinoma, vitiligo, anemia of chronic disease.   Clinical Impression  Pt admitted with above diagnosis. PTA, pt was ambulating using a SPC. He required assistance with ADLs/IADLs. Pt lives with his family in a one story house with 1 STE. Pt currently with functional limitations due to the deficits listed below (see PT Problem List). He required totalA for bed mobility and totalA for seated balance EOB d/t significant posterior lean. Examination limited by lethargy, impaired cognition, and lack of command following. Pt will benefit from acute skilled PT to maximize his independence and safety with mobility to allow discharge. Recommend HHPT to maintain strength, improve balance and activity tolerance, decrease fall risk, decrease caregiver burden, and optimize safety within the home environment.      If plan is discharge home, recommend the following: Two people to help with walking and/or transfers;Two people to help with bathing/dressing/bathroom;Assistance with cooking/housework;Assistance with feeding;Assist for transportation;Help with stairs or ramp for entrance   Can travel by private vehicle        Equipment Recommendations Hospital bed;Hoyer lift;Wheelchair cushion (measurements PT);Wheelchair  (measurements PT)  Recommendations for Other Services       Functional Status Assessment Patient has had a recent decline in their functional status and/or demonstrates limited ability to make significant improvements in function in a reasonable and predictable amount of time     Precautions / Restrictions Precautions Precautions: Fall Recall of Precautions/Restrictions: Impaired Restrictions Weight Bearing Restrictions Per Provider Order: No      Mobility  Bed Mobility Overal bed mobility: Needs Assistance Bed Mobility: Supine to Sit, Sit to Supine     Supine to sit: Total assist, HOB elevated Sit to supine: Total assist, HOB elevated   General bed mobility comments: Pt sat up on L side of bed with increased time. No active participation or command following noted. Brought BLE off EOB. Elevated trunk with use of bed pad. Scooted pt fwd. Seated EOB, pt became rigid with BUE and BLE extended and demonstrated posterior lean. He began to slide fwd. PT facilitated quick return to bed and repositioned using bed features and +2 assist.    Transfers Overall transfer level: Needs assistance                 General transfer comment: Deferred d/t lack of +2 assist available for safety and lethargy.    Ambulation/Gait               General Gait Details: Unable  Stairs            Wheelchair Mobility     Tilt Bed    Modified Rankin (Stroke Patients Only)       Balance Overall balance assessment: Needs assistance Sitting-balance support: Feet supported, Bilateral upper extremity supported Sitting balance-Leahy Scale: Zero Sitting balance - Comments: Pt was unable to maintain static seated balance EOB. He demonstrated a significant posterior lean and BUE/BLE extension where he  began to slide off bed. Postural control: Posterior lean                                   Pertinent Vitals/Pain Pain Assessment Pain Assessment: Faces Faces Pain  Scale: Hurts little more Pain Location: Generalized with movement Pain Descriptors / Indicators: Grimacing, Moaning Pain Intervention(s): Monitored during session, Limited activity within patient's tolerance, Repositioned    Home Living Family/patient expects to be discharged to:: Private residence Living Arrangements: Children;Spouse/significant other;Other relatives (Wife, Daughter, Son-in-Law, and Deirdre) Available Help at Discharge: Family;Available 24 hours/day Type of Home: House Home Access: Stairs to enter Entrance Stairs-Rails: None Entrance Stairs-Number of Steps: 1 (threshold)   Home Layout: One level Home Equipment: Cane - single point;Transport chair      Prior Function Prior Level of Function : Needs assist       Physical Assist : ADLs (physical)   ADLs (physical): Bathing;Dressing;Toileting;IADLs Mobility Comments: Ambulates with SPC. Unsure of fall hx. ADLs Comments: He was able to feed himself, brush teeth, and comb hair at home. Family would assist with all basic self-care. Pt would typically be dressed seated. Family would bath him in the shower while seated. He needed assist with pericare. Family manages all IADLs.     Extremity/Trunk Assessment   Upper Extremity Assessment Upper Extremity Assessment: Difficult to assess due to impaired cognition;Right hand dominant;RUE deficits/detail;LUE deficits/detail RUE Deficits / Details: Decreased AROM. PROM WFL. Pt intermittently resisting likely d/t pain. Unable to assess strength, pt with no command following. LUE Deficits / Details: Decrease AROM and PROM. Pt fully resisting against shoulder flex/abd and elbow flex. Unable to assess strength d/t no command following.    Lower Extremity Assessment Lower Extremity Assessment: Difficult to assess due to impaired cognition;RLE deficits/detail;LLE deficits/detail RLE Deficits / Details: Decreased AROM. PROM WFL. Pt intermittently resisting likely d/t pain.  Unable to assess strength, pt with no command following. LLE Deficits / Details: Decrease AROM and PROM. Pt fully resisting against hip flex, hip abd/add, and knee flex. Unable to assess strength d/t no command following.    Cervical / Trunk Assessment Cervical / Trunk Assessment: Kyphotic  Communication   Communication Communication: Impaired Factors Affecting Communication: Difficulty expressing self;Reduced clarity of speech (Pt's daughter provided Khmer translation throughout session.)    Cognition Arousal: Lethargic Behavior During Therapy: Flat affect   PT - Cognitive impairments: Difficult to assess Difficult to assess due to: Level of arousal, Non-English speaking, Impaired communication                     PT - Cognition Comments: Pt greeted asleep in bed. He opened his eyes after mutliple verbal cues from his daughter and tactile stimualtion from PT. Pt had hiccups throughout session. No verbalization or head knod/shake to communicate witnessed. Following commands: Impaired Following commands impaired: Follows one step commands inconsistently     Cueing Cueing Techniques: Verbal cues, Gestural cues, Tactile cues, Visual cues     General Comments      Exercises     Assessment/Plan    PT Assessment Patient needs continued PT services  PT Problem List Decreased strength;Decreased range of motion;Decreased activity tolerance;Decreased balance;Decreased mobility;Decreased cognition;Decreased knowledge of use of DME;Decreased safety awareness;Pain       PT Treatment Interventions DME instruction;Gait training;Functional mobility training;Therapeutic activities;Therapeutic exercise;Balance training;Cognitive remediation;Patient/family education;Wheelchair mobility training    PT Goals (Current goals can be found in the Care  Plan section)  Acute Rehab PT Goals Patient Stated Goal: Family reports to make the pt comfortable PT Goal Formulation: Patient unable to  participate in goal setting Time For Goal Achievement: 02/26/24 Potential to Achieve Goals: Poor    Frequency Min 1X/week     Co-evaluation               AM-PAC PT 6 Clicks Mobility  Outcome Measure Help needed turning from your back to your side while in a flat bed without using bedrails?: Total Help needed moving from lying on your back to sitting on the side of a flat bed without using bedrails?: Total Help needed moving to and from a bed to a chair (including a wheelchair)?: Total Help needed standing up from a chair using your arms (e.g., wheelchair or bedside chair)?: Total Help needed to walk in hospital room?: Total Help needed climbing 3-5 steps with a railing? : Total 6 Click Score: 6    End of Session   Activity Tolerance: Patient limited by lethargy;Patient limited by fatigue;Patient limited by pain Patient left: in bed;with call bell/phone within reach;with bed alarm set;with family/visitor present Nurse Communication: Mobility status;Need for lift equipment PT Visit Diagnosis: Adult, failure to thrive (R62.7);Muscle weakness (generalized) (M62.81);Other abnormalities of gait and mobility (R26.89);Unsteadiness on feet (R26.81);Difficulty in walking, not elsewhere classified (R26.2)    Time: 1429-1450 PT Time Calculation (min) (ACUTE ONLY): 21 min   Charges:   PT Evaluation $PT Eval Moderate Complexity: 1 Mod   PT General Charges $$ ACUTE PT VISIT: 1 Visit         Randall SAUNDERS, PT, DPT Acute Rehabilitation Services Office: [phone] Secure Chat Preferred  Delon CHRISTELLA Callander 02/12/2024, 3:42 PM

## 2024-02-12 NOTE — Progress Notes (Signed)
 PROGRESS NOTE                                                                                                                                                                                                             Patient Demographics:    John Padilla, is a 78 y.o. male, DOB - 16-Aug-1945, FMW:[id number]  Outpatient Primary MD for the patient is Pcp, No    LOS - 1  Admit date - 02/10/2024    Chief Complaint  Patient presents with   Fatigue       Brief Narrative (HPI from H&P)   78 y.o. male with medical history significant for chronic hepatitis C, hepatocellular carcinoma, vitiligo, anemia of chronic disease, ambulatory dysfunction, uses a walker at baseline, who presents to the ER from home due to lethargy, altered mental status, and generalized weakness.  The patient was diagnosed with hepatocellular carcinoma 2 years ago and opted out of treatment.  He lives alone.  Has not seen a medical provider since his diagnosis 2 years ago.  A few days ago, he was noted to have a significant decline in his functional status.  Associated with abdominal distention, poor oral intake, and bilateral lower extremity edema, left greater than right.  His daughter brought him to her home on Wednesday 02/06/24.  However, he has not been eating or drinking adequately.  Today, he only took 2 bites of his meal.   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   CT chest abdomen pelvis with contrast revealed large, markedly complex right hepatic mass with multiple cystic areas, measuring 12.7 x 9.7 cm.  This could reflect abscess or malignancy.  Marked dilation of the intrahepatic and extrahepatic bile ducts with a  common bile duct measuring up to 15 mm.  Moderate to large volume ascites throughout the abdomen and pelvis.  Recommend further evaluation of the above findings with MRI abdomen with contrast and MRCP.    Subjective:    John Padilla today has, appears to be in no distress overall appears quite weak and cachectic, denies any headache chest or abdominal pain.   Assessment  & Plan :  Underlying history of chronic hepatitis C, known history of hepatocellular carcinoma, refused treatment several years ago.  Now severe extension of hepatocellular carcinoma, now extending into the portal veins, patient did not intend to get any treatment few years ago now in extremely guarded condition.  Presenting with acute metabolic encephalopathy, due to possible SBP however   Prognosis extremely poor, he has advanced known hepatocellular carcinoma for which he did not want treatment 2 years ago, case discussed with GI physician Dr. Albertus on 02/12/2024, patient will be best served with comfort measures, had extensive discussion with patient's daughter and son-in-law who now agree with it.  Patient is DNR, full care will be directed on keeping him comfortable, they want antibiotics to be continued for a few days for clinical diagnosis of SBP which I will continue.  Palliative care will be involved, all care will be directed towards keeping him comfortable. Secondary to sepsis versus malignancy versus others Continue to treat underlying conditions Reorient as needed. Fall precautions Aspiration precautions. CT head was non-acute Ammonia level within normal limits, 24.   Sepsis secondary to suspected intra-abdominal infection, possible acute cholangitis/possible SBP, POA Likely due to SBP, continue empiric antibiotics for a few days per family wishes, goal of care is now comfort.   Chronic hepatitis C, hepatocellular carcinoma Used treatment several years ago, was previously seen by Dr. Lanny.  Now comfort  measures.   Large ascites secondary to the above Ultrasound-guided paracentesis failed, supportive care.   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  Code Status : DNR  Consults  : Case discussed with GI Dr. Albertus over the phone on 02/12/2024, palliative care on board  PUD Prophylaxis :    Procedures  :     CT Head - Non acute  MRCP -  1. Interval progression of large right hepatic lobe mass with extension into the left hepatic lobe, compatible with infiltrative malignancy. Finding consistent with hepatocellular carcinoma . 2. 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 Complete occlusion of portal system. 3. Large-volume ascites. 4. No intrahepatic or extrahepatic biliary ductal dilatation.      Disposition Plan  :    Status is: Inpatient  DVT Prophylaxis  :    SCDs Start: 02/11/24 0211    Lab Results  Component Value Date   PLT 140 (L) 02/12/2024    Diet :  Diet Order             DIET SOFT Fluid consistency: Thin  Diet effective now                     Inpatient Medications  Scheduled Meds: Continuous Infusions:  cefTRIAXone (  ROCEPHIN)  IV 2 g (02/12/24 0208)   metronidazole Stopped (02/12/24 0018)   PRN Meds:.morphine  injection, oxyCODONE , polyethylene glycol, prochlorperazine  Antibiotics  :    Anti-infectives (From admission, onward)    Start     Dose/Rate Route Frequency Ordered Stop   02/11/24 0230  cefTRIAXone (ROCEPHIN) 2 g in sodium chloride  0.9 % 100 mL IVPB        2 g 200 mL/hr over 30 Minutes Intravenous Every 24 hours 02/11/24 0226     02/11/24 0230  metroNIDAZOLE (FLAGYL) IVPB 500 mg        500 mg 100 mL/hr over 60 Minutes Intravenous Every 12 hours 02/11/24 0226           Objective:   Vitals:   02/12/24 0405 02/12/24 0410 02/12/24 0415 02/12/24 0420  BP:      Pulse:      Resp: 11 12 13 16   Temp:      TempSrc:      SpO2:      Weight:      Height:        Wt Readings from Last 3 Encounters:  02/11/24 45.4 kg  10/05/21 62.3 kg  09/23/21 64.9 kg     Intake/Output Summary (Last 24 hours) at 02/12/2024 0814 Last data filed at 02/12/2024 0600 Gross per 24 hour  Intake --  Output 120 ml  Net -120 ml     Physical Exam  Awake mildly confused, in no distress, denies any headache chest or abdominal pain, feels weak all over.  Does have scleral icterus, mild to moderate ascites, extremely weak and cachectic. No new F.N deficits,  [place].AT, Supple Neck, No JVD,   Symmetrical Chest wall movement, Good air movement bilaterally, CTAB RRR,No Gallops,Rubs or new Murmurs,  +ve B.Sounds, No tenderness,   No Cyanosis, Clubbing or edema       Data Review:    Recent Labs  Lab 02/10/24 2221 02/11/24 0600 02/12/24 0615  WBC 15.5* 15.0* 15.5*  HGB 8.5* 7.4* 6.7*  HCT 24.2* 21.9* 19.0*  PLT 149* 134* 140*  MCV 93.8 99.5 96.4  MCH 32.9 33.6 34.0  MCHC 35.1 33.8 35.3  RDW 18.9* 19.9* 20.0*  LYMPHSABS  --  2.3  --   MONOABS  --  1.3*  --   EOSABS  --  0.0  --   BASOSABS  --  0.0  --     Recent  Labs  Lab 02/10/24 2221 02/10/24 2235 02/10/24 2316 02/11/24 0135 02/11/24 0600 02/12/24 0615  NA 127*  --   --   --  129* 130*  K 4.7  --   --   --  4.4 4.0  CL 97*  --   --   --  100 100  CO2 16*  --   --   --  15* 17*  ANIONGAP 14  --   --   --  14 13  GLUCOSE 102*  --   --   --  115* 95  BUN 39*  --   --   --  40* 48*  CREATININE 1.82*  --   --   --  1.73* 1.68*  AST 842*  --   --   --  836* 763*  ALT 161*  --   --   --  149* 135*  ALKPHOS 233*  --   --   --  224* 212*  BILITOT 7.4*  --   --   --  7.7*  7.4*  ALBUMIN 1.5*  --   --   --  2.0* 2.0*  CRP  --   --   --   --   --  8.2*  PROCALCITON  --   --   --   --   --  1.74  LATICACIDVEN  --  5.6*  --  4.1* 4.0*  --   INR  --   --  1.9*  --   --  2.1*  AMMONIA  --   --  24  --   --  28  BNP  --   --   --   --  110.8*  --   MG  --   --   --   --  2.0 1.9  PHOS  --   --   --   --  4.1 3.7  CALCIUM 8.1*  --   --   --  8.1* 8.1*      Recent Labs  Lab 02/10/24 2221 02/10/24 2235 02/10/24 2316 02/11/24 0135 02/11/24 0600 02/12/24 0615  CRP  --   --   --   --   --  8.2*  PROCALCITON  --   --   --   --   --  1.74  LATICACIDVEN  --  5.6*  --  4.1* 4.0*  --   INR  --   --  1.9*  --   --  2.1*  AMMONIA  --   --  24  --   --  28  BNP  --   --   --   --  110.8*  --   MG  --   --   --   --  2.0 1.9  CALCIUM 8.1*  --   --   --  8.1* 8.1*    --------------------------------------------------------------------------------------------------------------- No results found for: CHOL, HDL, LDLCALC, LDLDIRECT, TRIG, CHOLHDL  No results found for: HGBA1C No results for input(s): TSH, T4TOTAL, FREET4, T3FREE, THYROIDAB in the last 72 hours. No results for input(s): VITAMINB12, FOLATE, FERRITIN, TIBC, IRON, RETICCTPCT in the last 72 hours. ------------------------------------------------------------------------------------------------------------------ Cardiac Enzymes No results for input(s):  CKMB, TROPONINI, MYOGLOBIN in the last 168 hours.  Invalid input(s): CK  Micro Results Recent Results (from the past 240 hours)  Culture, blood (routine x 2)     Status: None (Preliminary result)   Collection Time: 02/10/24 11:16 PM   Specimen: BLOOD RIGHT ARM  Result Value Ref Range Status   Specimen Description BLOOD RIGHT ARM  Final   Special Requests   Final    BOTTLES DRAWN AEROBIC AND ANAEROBIC Blood Culture adequate volume   Culture   Final    NO GROWTH 2 DAYS Performed at [facility], [address] [place], [place] [address]    Report Status PENDING  Incomplete  Culture, blood (routine x 2)     Status: None (Preliminary result)   Collection Time: 02/10/24 11:18 PM   Specimen: BLOOD LEFT HAND  Result Value Ref Range Status   Specimen Description BLOOD LEFT HAND  Final   Special Requests   Final    BOTTLES DRAWN AEROBIC ONLY Blood Culture adequate volume   Culture   Final    NO GROWTH 2 DAYS Performed at [facility], [address], [place], [place] [address]    Report Status PENDING  Incomplete  MRSA Next Gen by PCR, Nasal     Status: None   Collection Time: 02/12/24  5:22  AM   Specimen: Nasal Mucosa; Nasal Swab  Result Value Ref Range Status   MRSA by PCR Next Gen NOT DETECTED NOT DETECTED Final    Comment: (NOTE) The GeneXpert MRSA Assay (FDA approved for NASAL specimens only), is one component of a comprehensive MRSA colonization surveillance program. It is not intended to diagnose MRSA infection nor to guide or monitor treatment for MRSA infections. Test performance is not FDA approved in patients less than 73 years old. Performed at [facility], [address] [place], [place] [address]     Radiology Report IR Paracentesis Result Date: 02/11/2024 INDICATION: 78 year old male. History of altered mental status found to have ascites request for diagnostic paracentesis EXAM: ULTRASOUND GUIDED DIAGNOSTIC PARACENTESIS MEDICATIONS:  Lidocaine  1% 10 mL COMPLICATIONS: None immediate. PROCEDURE: Informed written consent was obtained from the patient after a discussion of the risks, benefits and alternatives to treatment. A timeout was performed prior to the initiation of the procedure. Initial ultrasound scanning demonstrates a trace amount of ascites within the right lower abdominal quadrant. The right lower abdomen was prepped and draped in the usual sterile fashion. 1% lidocaine  was used for local anesthesia. Following this, a 19 gauge, 7-cm, Yueh catheter was introduced. An ultrasound image was saved for documentation purposes. The paracentesis was unsuccessful. The catheter was removed and a dressing was applied. The patient tolerated the procedure well without immediate post procedural complication. FINDINGS: Unsuccessful paracentesis IMPRESSION: Unsuccessful ultrasound-guided paracentesis. Performed by Delon Beagle NP Electronically Signed   By: JONETTA Faes M.D.   On: 02/11/2024 15:56   VAS US  LOWER EXTREMITY VENOUS (DVT) Result Date: 02/11/2024  Lower Venous DVT Study Patient Name:  KYRIN GRATZ  Date of Exam:   02/11/2024 Medical Rec #: [id number]   Accession #:    [id number] Date of Birth: November 12, 1945   Patient Gender: M Patient Age:   26 years Exam Location:  [facility] Procedure:      VAS US  LOWER EXTREMITY VENOUS (DVT) Referring Phys: TERRY HALL --------------------------------------------------------------------------------  Indications: Swelling.  Risk Factors: None identified. Comparison Study: No prior studies. Performing Technologist: Cordella Collet RVT  Examination Guidelines: A complete evaluation includes B-mode imaging, spectral Doppler, color Doppler, and power Doppler as needed of all accessible portions of each vessel. Bilateral testing is considered an integral part of a complete examination. Limited examinations for reoccurring indications may be performed as noted. The reflux portion of the exam is  performed with the patient in reverse Trendelenburg.  +---------+---------------+---------+-----------+----------+--------------+ RIGHT    CompressibilityPhasicitySpontaneityPropertiesThrombus Aging +---------+---------------+---------+-----------+----------+--------------+ CFV      Full           Yes      Yes                                 +---------+---------------+---------+-----------+----------+--------------+ SFJ      Full                                                        +---------+---------------+---------+-----------+----------+--------------+ FV Prox  Full                                                        +---------+---------------+---------+-----------+----------+--------------+  FV Mid   Full                                                        +---------+---------------+---------+-----------+----------+--------------+ FV DistalFull                                                        +---------+---------------+---------+-----------+----------+--------------+ PFV      Full                                                        +---------+---------------+---------+-----------+----------+--------------+ POP      Full           Yes      Yes                                 +---------+---------------+---------+-----------+----------+--------------+ PTV      Full                                                        +---------+---------------+---------+-----------+----------+--------------+ PERO     Full                                                        +---------+---------------+---------+-----------+----------+--------------+   +---------+---------------+---------+-----------+----------+--------------+ LEFT     CompressibilityPhasicitySpontaneityPropertiesThrombus Aging +---------+---------------+---------+-----------+----------+--------------+ CFV      Full           Yes      Yes                                  +---------+---------------+---------+-----------+----------+--------------+ SFJ      Full                                                        +---------+---------------+---------+-----------+----------+--------------+ FV Prox  Full                                                        +---------+---------------+---------+-----------+----------+--------------+ FV Mid   Full                                                        +---------+---------------+---------+-----------+----------+--------------+  FV DistalFull                                                        +---------+---------------+---------+-----------+----------+--------------+ PFV      Full                                                        +---------+---------------+---------+-----------+----------+--------------+ POP      Full           Yes      Yes                                 +---------+---------------+---------+-----------+----------+--------------+ PTV      Full                                                        +---------+---------------+---------+-----------+----------+--------------+ PERO     Full                                                        +---------+---------------+---------+-----------+----------+--------------+     Summary: RIGHT: - There is no evidence of deep vein thrombosis in the lower extremity.  - No cystic structure found in the popliteal fossa.  LEFT: - There is no evidence of deep vein thrombosis in the lower extremity.  - No cystic structure found in the popliteal fossa.  *See table(s) above for measurements and observations. Electronically signed by Lonni Gaskins MD on 02/11/2024 at 1:12:29 PM.    Final    MR ABDOMEN MRCP W WO CONTAST Result Date: 02/11/2024 EXAM: MRCP WITH AND WITHOUT IV CONTRAST 02/11/2024 08:54:00 AM TECHNIQUE: Multisequence, multiplanar magnetic resonance images of the abdomen with and without intravenous  contrast. MRCP sequences were performed. 6 mL gadobutrol  (GADAVIST ) 1 MMOL/ML injection 6 mL GADOBUTROL  1 MMOL/ML IV SOLN was administered. COMPARISON: CT 02/11/2024 and MRI 09/23/2021. CLINICAL HISTORY: Biliary obstruction suspected (Ped 0-17y); 824029 Biliary obstruction (HCC) [id number]. FINDINGS: LIVER: Interval progression of hepatic mass within the right hepatic lobe compared to MRI 2023. The majority of the right hepatic lobe is involved by a confluent infiltrate 11.5 x 10.9 cm mass on image 18 of series 3. There is extension into the left hepatic lobe on the same image. There is a tumor thrombus within the left and right portal veins and extending to the main portal vein. The main portal vein is expanded to 2.9 cm on image 20/series 3 consistent with tumor thrombus. GALLBLADDER AND BILIARY SYSTEM: The gallbladder is normal. There is no intrahepatic biliary ductal dilatation or extrahepatic biliary ductal dilatation. SPLEEN: Unremarkable. PANCREAS/PANCREATIC DUCT: The pancreas is grossly normal. Pancreatic ductal dilatation. ADRENAL GLANDS: Unremarkable. KIDNEYS: Benign nonenhancing cyst of the left kidney measures 15 mm. LYMPH NODES: No enlarged abdominal lymph nodes. VASCULATURE: Tumor thrombus within  the left and right portal veins extending to the main portal vein, which is expanded to 2.9 cm, consistent with tumor thrombus. PERITONEUM: Large volume of ascites surrounds the liver and spleen. ABDOMINAL WALL: No hernia. No mass. BOWEL: Grossly unremarkable. No bowel obstruction. BONES: No acute abnormality or worrisome osseous lesion. SOFT TISSUES: Unremarkable. MISCELLANEOUS: Unremarkable. IMPRESSION: 1. Interval progression of large right hepatic lobe mass with extension into the left hepatic lobe, compatible with infiltrative malignancy. Finding consistent with hepatocellular carcinoma . 2. 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 Complete occlusion of portal system. 3. Large-volume ascites. 4. No intrahepatic or extrahepatic biliary ductal dilatation. Electronically signed by: Norleen Boxer MD 02/11/2024 10:35 AM EST RP Workstation: [id number]   MR 3D Recon At Scanner Result Date: 02/11/2024 EXAM: MRCP WITH AND WITHOUT IV CONTRAST 02/11/2024 08:54:00 AM TECHNIQUE: Multisequence, multiplanar magnetic resonance images of the abdomen with and without intravenous contrast. MRCP sequences were performed. 6 mL gadobutrol  (GADAVIST ) 1 MMOL/ML injection 6 mL GADOBUTROL  1 MMOL/ML IV SOLN was administered. COMPARISON: CT 02/11/2024 and MRI 09/23/2021. CLINICAL HISTORY: Biliary obstruction suspected (Ped 0-17y); 824029 Biliary obstruction (HCC) [id number]. FINDINGS: LIVER: Interval progression of hepatic mass within the right hepatic lobe compared to MRI 2023. The majority of the right hepatic lobe is involved by a confluent infiltrate 11.5 x 10.9 cm mass on image 18 of series 3. There is extension into the left hepatic lobe on the same image. There is a tumor thrombus within the left and right portal veins and extending to the main portal vein. The main portal vein is expanded to 2.9 cm on image 20/series 3 consistent with tumor thrombus. GALLBLADDER AND BILIARY SYSTEM: The gallbladder is normal. There is no intrahepatic biliary ductal dilatation or extrahepatic biliary ductal dilatation. SPLEEN: Unremarkable. PANCREAS/PANCREATIC DUCT: The pancreas is grossly normal. Pancreatic ductal dilatation. ADRENAL GLANDS: Unremarkable. KIDNEYS: Benign nonenhancing cyst of the left kidney measures 15 mm. LYMPH NODES: No enlarged abdominal lymph nodes. 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 PERITONEUM: Large volume of ascites surrounds the liver and spleen. ABDOMINAL WALL: No hernia. No mass. BOWEL: Grossly unremarkable. No bowel  obstruction. BONES: No acute abnormality or worrisome osseous lesion. SOFT TISSUES: Unremarkable. MISCELLANEOUS: Unremarkable. IMPRESSION: 1. Interval progression of large right hepatic lobe mass with extension into the left hepatic lobe, compatible with infiltrative malignancy. Finding consistent with hepatocellular carcinoma . 2. 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 Complete occlusion of portal system. 3. Large-volume ascites. 4. No intrahepatic or extrahepatic biliary ductal dilatation. Electronically signed by: Norleen Boxer MD 02/11/2024 10:35 AM EST RP Workstation: [id number]   CT Head Wo Contrast Result Date: 02/11/2024 CLINICAL DATA:  Initial evaluation for acute mental status change, unknown cause. EXAM: CT HEAD WITHOUT CONTRAST TECHNIQUE: Contiguous axial images were obtained from the base of the skull through the vertex without intravenous contrast. RADIATION DOSE REDUCTION: This exam was performed according to the departmental dose-optimization program which includes automated exposure control, adjustment of the mA and/or kV according to patient size and/or use of iterative reconstruction technique. COMPARISON:  CT from 09/22/2021. FINDINGS: Brain: Age-related cerebral atrophy with moderate to advanced chronic microvascular ischemic disease. Remote lacunar infarct present at the right basal ganglia. Symmetric calcification/mineralization noted within  the bilateral globus palladi. Small remote right cerebellar infarct. No acute intracranial hemorrhage. No acute large vessel territory infarct. No mass lesion or midline shift. No hydrocephalus or extra-axial fluid collection. No acute Vascular: No abnormal hyperdense vessel. Calcified atherosclerosis present at skull base. Skull: Scalp soft tissues within normal limits.  Calvarium intact. Sinuses/Orbits: Globes orbital soft tissues within normal limits. Paranasal  sinuses are largely clear. No significant mastoid effusion. Other: None. IMPRESSION: 1. No acute intracranial abnormality. 2. Age-related cerebral atrophy with moderate to advanced chronic microvascular ischemic disease, with small remote right cerebellar and right basal ganglia infarcts. Electronically Signed   By: Morene Hoard M.D.   On: 02/11/2024 01:30   CT CHEST ABDOMEN PELVIS W CONTRAST Result Date: 02/11/2024 EXAM: CT CHEST, ABDOMEN AND PELVIS WITH CONTRAST 02/11/2024 12:57:41 AM TECHNIQUE: CT of the chest, abdomen and pelvis was performed with the administration of 75 mL iohexol  (OMNIPAQUE ) 350 MG/ML injection. Multiplanar reformatted images are provided for review. Automated exposure control, iterative reconstruction, and/or weight based adjustment of the mA/kV was utilized to reduce the radiation dose to as low as reasonably achievable. COMPARISON: 09/23/2021 CLINICAL HISTORY: Sepsis. FINDINGS: CHEST: MEDIASTINUM AND LYMPH NODES: Heart and pericardium are unremarkable. The central airways are clear. No mediastinal, hilar or axillary lymphadenopathy. LUNGS AND PLEURA: Bibasilar atelectasis. No pleural effusion or pneumothorax. ABDOMEN AND PELVIS: LIVER: Large, markedly complex mass or process in the right lobe of the liver with multiple cystic areas overall abnormal area measures 12.7 x 9.7 cm in the right hepatic lobe. GALLBLADDER AND BILE DUCTS: Markedly dilated intrahepatic and extrahepatic biliary ducts. Common bile duct measures up to 15 mm. SPLEEN: No acute abnormality. PANCREAS: No acute abnormality. ADRENAL GLANDS: No acute abnormality. KIDNEYS, URETERS AND BLADDER: 1.4 cm cyst in the mid pole of the left kidney appears benign. Per consensus, no follow-up is needed for simple Bosniak type 1 and 2 renal cysts, unless the patient has a malignancy history or risk factors. No stones in the kidneys or ureters. No hydronephrosis. No perinephric or periureteral stranding. Urinary bladder is  unremarkable. GI AND BOWEL: Stomach demonstrates no acute abnormality. There is no bowel obstruction. REPRODUCTIVE ORGANS: No acute abnormality. PERITONEUM AND RETROPERITONEUM: Large volume ascites throughout the abdomen and pelvis. No free air. VASCULATURE: Diffuse aortic atherosclerosis. ABDOMINAL AND PELVIS LYMPH NODES: No lymphadenopathy. BONES AND SOFT TISSUES: No acute osseous abnormality. No focal soft tissue abnormality. IMPRESSION: 1. Large, markedly complex right hepatic mass with multiple cystic areas, measuring 12.7 x 9.7 cm. This could reflect abscess or malignancy. 2. Marked dilation of the intrahepatic and extrahepatic bile ducts, with the common bile duct measuring up to 15 mm. 3. Moderate-to-large volume ascites throughout the abdomen and pelvis. 4. Recommend further evaluation of the above findings with MRI abdomen with contrast and MRCP. Electronically signed by: Franky Crease MD 02/11/2024 01:28 AM EST RP Workstation: [id number]   DG Chest Port 1 View Result Date: 02/11/2024 EXAM: 1 VIEW(S) XRAY OF THE CHEST 02/11/2024 12:20:00 AM COMPARISON: 09/21/2021 CLINICAL HISTORY: weakness weakness FINDINGS: LUNGS AND PLEURA: Linear densities in the lung bases are compatible with atelectasis. No pulmonary edema. No pleural effusion. No pneumothorax. HEART AND MEDIASTINUM: Aortic atherosclerosis. BONES AND SOFT TISSUES: No acute osseous abnormality. IMPRESSION: 1. No acute cardiopulmonary findings. 2. Bibasilar atelectasis. Electronically signed by: Franky Crease MD 02/11/2024 12:40 AM EST RP Workstation: [id number]     Signature  -   Lavada Stank M.D on 02/12/2024 at 8:14 AM   -  To page go to [email or web address]

## 2024-02-12 NOTE — Progress Notes (Signed)
 SLP Cancellation Note  Patient Details Name: John Padilla MRN: [id number] DOB: Sep 10, 1945   Cancelled treatment:        Attempted to see pt for clinical swallowing assessment.  Per nursing, goals of care have changed to comfort. SLP will sign off at this time.    Anette FORBES Grippe, MA, CCC-SLP Acute Rehabilitation Services Office: [phone] 02/12/2024, 8:40 AM

## 2024-02-12 NOTE — Plan of Care (Signed)

## 2024-02-12 NOTE — Progress Notes (Signed)
 Patient ID: John Padilla, male   DOB: Dec 20, 1945, 78 y.o.   MRN: [id number]    Progress Note from the Palliative Medicine Team at [facility]   Patient Name: John Padilla        Date: 02/12/2024 DOB: 1945-08-26  Age: 78 y.o. MRN#: [id number] Attending Physician: Dennise Lavada POUR, MD Primary Care Physician: Pcp, No Admit Date: 02/10/2024   Reason for Consultation/Follow-up   Establishing Goals of Care   HPI/ Brief Hospital Review John Padilla is a 78 y.o male admitted 02/10/24 for AMS. He presented with lethargy, generalized weakness, abdominal distention, poor oral intake, and bilateral lower extremity edema.   CT chest abdomen pelvis with contrast revealed large, markedly complex right hepatic mass with multiple cystic areas, measuring 12.7 x 9.7 cm; marked dilation of the intrahepatic and extrahepatic bile ductsl moderate to large volume ascites throughout the abdomen and pelvis.   IR with unsuccessful ultrasound-guided paracentesis.   PMHx: hronic hepatitis C, hepatocellular carcinoma, vitiligo, anemia of chronic disease.   Family face treatment option decisions, advanced directive decisions and anticipatory care needs.   Subjective  Extensive chart review has been completed prior to meeting with patient/family  including labs, vital signs, imaging, progress/consult notes, orders, medications and available advance directive documents.    This NP assessed patient at the bedside as a follow up to  yesterday's GOCs meeting.   Patient's daughter and wife at bedside.    Patient is extremely weak, he is taking oatmeal from daughter and seems to be enjoying it.  Continued education regarding the seriousness of John Padilla's medical situation; untreated hepatocellular cancer, chronic hepatitis, sepsis/elevated WBC, anemia of chronic disease and overall failure to thrive. Poor prognosis.  Detailed education offered between full medical interventions attempting to prolong life and a more  supportive path focusing on comfort and dignity allowing a natural death, we spoke specifically to interventions specific to IV fluids/antibiotics, labs, scan, transfusions.  Education offered on the natural trajectory and expectations at EOL.    Education offered on hospice benefit; philosophy and eligibility. Detailed education offered regarding hospice benefit in home verses IP hospice unit.  I communicated with family that John Padilla is hospice eligible with a limited prognosis, and may be  IP hospice if focus of care was full comfort foregoing life prolonging measures.    Education offered regarding option of SNF rehab/long term care.     Daughter/ [name] verbalizes understanding,   for today she wants to continue to treat the treatable, she remains hopeful for signs of improvements.    Family need time for outcomes before making any changes to treatment plan.   Education offered today regarding  the importance of continued conversation with family and their  medical providers regarding overall plan of care and treatment options,  ensuring decisions are within the context of the patients values and GOCs.  Family is open to ongoing conversation with PMT as they navigate difficult healthcare decisions.  Questions and concerns addressed   Discussed with primary team/ Dr Dennise and nursing staff   Time: 50  minutes  Detailed review of medical records ( labs, imaging, vital signs), medically appropriate exam ( MS, skin, cardiac,  resp)   discussed with treatment team, counseling and education to patient, family, staff, documenting clinical information, medication management, coordination of care    Ronal Plants NP  Palliative Medicine Team Team Phone # [phone] Pager [phone]

## 2024-02-12 NOTE — Progress Notes (Signed)
 Nutrition Brief Note  Chart reviewed. Pt now transitioning to comfort care.  No further nutrition interventions planned at this time.  Please re-consult as needed.   Olivia Kenning, RD Registered Dietitian  See Amion for more information

## 2024-02-13 DIAGNOSIS — Z515 Encounter for palliative care: Secondary | ICD-10-CM

## 2024-02-13 DIAGNOSIS — E871 Hypo-osmolality and hyponatremia: Secondary | ICD-10-CM

## 2024-02-13 DIAGNOSIS — R160 Hepatomegaly, not elsewhere classified: Secondary | ICD-10-CM

## 2024-02-13 MED ORDER — LORAZEPAM 2 MG/ML IJ SOLN
0.5000 mg | INTRAMUSCULAR | Status: DC | PRN
Start: 2024-02-13 — End: 2024-02-17
  Administered 2024-02-14 – 2024-02-16 (×5): 0.5 mg via INTRAVENOUS
  Filled 2024-02-13 (×5): qty 1

## 2024-02-13 MED ORDER — GLYCOPYRROLATE 0.2 MG/ML IJ SOLN: 0.2000 mg | INTRAMUSCULAR | Status: DC | PRN

## 2024-02-13 MED ORDER — HYDROMORPHONE HCL 1 MG/ML IJ SOLN
1.0000 mg | INTRAMUSCULAR | Status: DC | PRN
  Administered 2024-02-13 – 2024-02-16 (×11): 1 mg via INTRAVENOUS
  Filled 2024-02-13 (×11): qty 1

## 2024-02-13 NOTE — TOC Initial Note (Signed)
 Transition of Care [facility]) - Initial/Assessment Note    Patient Details  Name: John Padilla MRN: [id number] Date of Birth: 04-Nov-1945  Transition of Care [facility]) CM/SW Contact:    Marval Gell, RN Phone Number: 02/13/2024, 7:31 AM  Clinical Narrative:                  ICM will continue to follow and be available to assist with DC planning, currently following PMT for GOC and family determination of treatment and DC plan.    Barriers to Discharge: Other (must enter comment) (determining GOC)   Patient Goals and CMS Choice Patient states their goals for this hospitalization and ongoing recovery are:: unclear at this time, following for GOC w PMT          Expected Discharge Plan and Services                                              Prior Living Arrangements/Services                       Activities of Daily Living   ADL Screening (condition at time of admission) Independently performs ADLs?: Yes (appropriate for developmental age) Is the patient deaf or have difficulty hearing?: No Does the patient have difficulty seeing, even when wearing glasses/contacts?: No Does the patient have difficulty concentrating, remembering, or making decisions?: Yes  Permission Sought/Granted                  Emotional Assessment              Admission diagnosis:  Lactic acidosis [E87.20] Hyponatremia [E87.1] Liver mass [R16.0] AKI (acute kidney injury) [N17.9] AMS (altered mental status) [R41.82] Patient Active Problem List   Diagnosis Date Noted   AMS (altered mental status) 02/11/2024   Chronic hepatitis C without hepatic coma (HCC) 10/07/2021   Hepatocellular carcinoma (HCC) 10/05/2021   Toxic encephalopathy 09/24/2021   Acute blood loss anemia 09/24/2021   Closed displaced intertrochanteric fracture of right femur (HCC) 09/22/2021   Elevated liver enzymes 09/22/2021   Hyperbilirubinemia 09/22/2021   Coagulopathy 09/22/2021   Thrombocytopenia  09/22/2021   Alcohol use 09/22/2021   Hepatic steatosis 09/22/2021   Vitiligo 09/22/2021   Rhabdomyolysis 09/22/2021   Hyponatremia 09/22/2021   Normocytic anemia 09/22/2021   PCP:  Pcp, No Pharmacy:   [id number] [address],  - [address] [place] [address] Phone: [phone] Fax: [phone]     Social Drivers of Health (SDOH) Social History: SDOH Screenings   Food Insecurity: No Food Insecurity (02/12/2024)  Housing: Low Risk  (02/12/2024)  Transportation Needs: No Transportation Needs (02/12/2024)  Utilities: Not At Risk (02/12/2024)  Social Connections: Patient Declined (02/12/2024)  Tobacco Use: High Risk (02/10/2024)   SDOH Interventions:     Readmission Risk Interventions     No data to display

## 2024-02-13 NOTE — Plan of Care (Signed)
  Problem: Pain Managment: Goal: General experience of comfort will improve and/or be controlled Outcome: Progressing   Problem: Safety: Goal: Ability to remain free from injury will improve Outcome: Progressing

## 2024-02-13 NOTE — Progress Notes (Signed)
 Daily Progress Note   Patient Name: Elvie Maines       Date: 02/13/2024 DOB: 04/12/1945  Age: 78 y.o. MRN#: [id number] Attending Physician: Dennise Lavada POUR, MD Primary Care Physician: Pcp, No Admit Date: 02/10/2024 Length of Stay: 2 days  Reason for Consultation/Follow-up: Establishing goals of care, Non pain symptom management, and Pain control  Subjective:   Subjective: Patient is a 78 year old admitted with altered mental status.  Past medical history for chronic hepatitis C, hepatocellular cancer, vitiligo, anemia of chronic disease, and ambulatory dysfunction.  He presented to ED with lethargy, generalized weakness, abdominal distention, poor intake, and bilateral lower extremity edema.  He is found to have a large markedly complex right hepatic mass, dilatation of the intrahepatic and extrahepatic bile ducts.  He developed sepsis thought to be likely secondary to intra-abdominal infection either cholangitis or potential SBP.  His clinical condition continued to deteriorate.  Discussed with Dr. Dennise who reports he is being transitioned to comfort care and is actively dying.  I met today with patient's family including daughters, wife, and several other family members.  His daughter is spokesperson for the family and says that she was told this morning that he is dying.  I discussed with her my assessment and physical signs consistent with actively dying phase.  We discussed the opportunity we have moving forward is to plan for aggressive symptom management in case further symptoms arise.  Specifically discussed pain, anxiety, shortness of breath, nausea, and treatments for each of these.  Patient's grandson is at the bedside and reports that he needs to have an excuse for work.  I offered to complete this today as well.  Review of Systems  Objective:   Vital Signs:  BP 133/78 (BP Location: Left Wrist)   Pulse (!) 108   Temp 98.3 F (36.8 C) (Axillary)   Resp 14   Ht 5' 2 (1.575  m)   Wt 45.4 kg   SpO2 98%   BMI 18.29 kg/m   Physical Exam: General: Somnolent, eyes partially open, HENT: Tacky mucous membranes, slight hyperextension  cardiovascular: Tachycardic, pulses palpable radially and in feet.   Respiratory: Cheyne-Stokes breathing, no distress, poor air movement  Abdomen: distended Extremities: Lower extremity edema, vitiligo Neuro: Unresponsive  Imaging: @IMAGES @  I personally reviewed recent imaging.   Assessment & Plan:   Assessment: 78 year old male with chronic hepatitis C, hepatocellular carcinoma, vitiligo, anemia of chronic disease who is actively dying secondary to sepsis from intra-abdominal process  Recommendations/Plan: # Complex medical decision making/goals of care:  - Comfort care moving forward.  Extensive education provided today at the bedside regarding signs and symptoms of distress, physical signs of dying process, and plan for comfort moving forward.  Medications reviewed in detail with family as well.  - Provided note for family for work as requested.  - Discussed prognosis of hours to days.  -  Code Status: Limited: Do not attempt resuscitation (DNR) -DNR-LIMITED -Do Not Intubate/DNI   Prognosis: Hours - Days  # Symptom management: Patient is receiving these palliative interventions for symptom management with an intent to improve quality of life.   - Pain: Hydromorphone  as needed  - Anxiety: Ativan  as needed  - Excess secretions: Glycopyrrolate as needed  - Nausea: Compazine as needed  # Discharge Planning: Anticipated Hospital Death  -  Discussed with: Family, Dr. Dennise  Thank you for allowing the palliative care team to participate in the care Arley Grade.  Amaryllis Meissner, MD Palliative Care Provider PMT #  [phone]  If patient remains symptomatic despite maximum doses, please call PMT at [phone] between 0700 and 1900. Outside of these hours, please call attending, as PMT does not have night coverage.    I personally spent a total of 55 minutes in the care of the patient today including preparing to see the patient, getting/reviewing separately obtained history, performing a medically appropriate exam/evaluation, counseling and educating, referring and communicating with other health care professionals, and documenting clinical information in the EHR.

## 2024-02-13 NOTE — Plan of Care (Signed)
  Problem: Activity: Goal: Risk for activity intolerance will decrease Outcome: Progressing   Problem: Coping: Goal: Level of anxiety will decrease Outcome: Progressing   Problem: Elimination: Goal: Will not experience complications related to bowel motility Outcome: Progressing Goal: Will not experience complications related to urinary retention Outcome: Progressing   Problem: Pain Managment: Goal: General experience of comfort will improve and/or be controlled Outcome: Progressing   Problem: Safety: Goal: Ability to remain free from injury will improve Outcome: Progressing

## 2024-02-13 NOTE — Progress Notes (Signed)
 PROGRESS NOTE                                                                                                                                                                                                             Patient Demographics:    John Padilla, is a 78 y.o. male, DOB - [birth year], FMW:[id number]  Outpatient Primary MD for the patient is Pcp, No    LOS - 2  Admit date - 02/10/2024    Chief Complaint  Patient presents with   Fatigue       Brief Narrative (HPI from H&P)   78 y.o. male with medical history significant for chronic hepatitis C, hepatocellular carcinoma, vitiligo, anemia of chronic disease, ambulatory dysfunction, uses a walker at baseline, who presents to the ER from home due to lethargy, altered mental status, and generalized weakness.  The patient was diagnosed with hepatocellular carcinoma 2 years ago and opted out of treatment.  He lives alone.  Has not seen a medical provider since his diagnosis 2 years ago.  A few days ago, he was noted to have a significant decline in his functional status.  Associated with abdominal distention, poor oral intake, and bilateral lower extremity edema, left greater than right.  His daughter brought him to her home on Wednesday 02/06/24.  However, he has not been eating or drinking adequately.  Today, he only took 2 bites of his meal.   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   CT chest abdomen pelvis with contrast revealed large, markedly complex right hepatic mass with multiple cystic areas, measuring 12.7 x 9.7 cm.  This could reflect abscess or malignancy.  Marked dilation of the intrahepatic and extrahepatic bile ducts with a  common bile duct measuring up to 15 mm.  Moderate to large volume ascites throughout the abdomen and pelvis.  Recommend further evaluation of the above findings with MRI abdomen with contrast and MRCP.    Subjective:    John Padilla today in bed, in no distress but more somnolent this morning.   Assessment  & Plan :   Underlying history of chronic hepatitis C, known history of  hepatocellular carcinoma, refused treatment several years ago.  Now severe extension of hepatocellular carcinoma, now extending into the portal veins, patient did not intend to get any treatment few years ago now in extremely guarded condition.  Presenting with acute metabolic encephalopathy, due to possible SBP however   Prognosis extremely poor, he has advanced known hepatocellular carcinoma for which he did not want treatment 2 years ago, case discussed with GI physician Dr. Albertus on 02/12/2024, patient will be best served with comfort measures, had extensive discussion with patient's daughter and son-in-law who now agree with it.  Patient is DNR, full care will be directed on keeping him comfortable, they want antibiotics to be continued for a few days for clinical diagnosis of SBP which I will continue.  Palliative care will be involved, all care will be directed towards keeping him comfortable.  He is now on full comfort care.  Expect him to pass away in the next few days.   Sepsis secondary to suspected intra-abdominal infection, possible acute cholangitis/possible SBP, POA Likely due to SBP, continue empiric antibiotics for a few days per family wishes, goal of care is now comfort.   Chronic hepatitis C, hepatocellular carcinoma Used treatment several years ago, was previously seen by Dr. Lanny.  Now comfort measures.   Large ascites secondary to the above Ultrasound-guided paracentesis failed, supportive care.   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  Code Status : DNR  Consults  : Case discussed with GI Dr. Albertus over the phone on 02/12/2024, palliative care on board  PUD Prophylaxis :    Procedures  :     CT Head - Non acute  MRCP -  1. Interval progression of large right hepatic lobe mass with extension into the left hepatic lobe, compatible with infiltrative malignancy. Finding consistent with hepatocellular carcinoma . 2. 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 Complete occlusion of portal system. 3. Large-volume ascites. 4. No intrahepatic or extrahepatic biliary ductal dilatation.      Disposition Plan  :    Status is: Inpatient  DVT Prophylaxis  :    SCDs Start: 02/11/24 0211    Lab Results  Component Value Date   PLT 140 (L) 02/12/2024    Diet :  Diet Order             DIET SOFT Fluid consistency: Thin  Diet effective now                    Inpatient Medications  Scheduled Meds: Continuous Infusions:  cefTRIAXone (ROCEPHIN)  IV 2 g (02/13/24 0211)   metronidazole 500 mg (02/12/24 2222)   PRN Meds:.HYDROmorphone  (DILAUDID ) injection,  oxyCODONE ,  polyethylene glycol, prochlorperazine  Antibiotics  :    Anti-infectives (From admission, onward)    Start     Dose/Rate Route Frequency Ordered Stop   02/11/24 0230  cefTRIAXone (ROCEPHIN) 2 g in sodium chloride  0.9 % 100 mL IVPB        2 g 200 mL/hr over 30 Minutes Intravenous Every 24 hours 02/11/24 0226     02/11/24 0230  metroNIDAZOLE (FLAGYL) IVPB 500 mg        500 mg 100 mL/hr over 60 Minutes Intravenous Every 12 hours 02/11/24 0226           Objective:   Vitals:   02/12/24 0415 02/12/24 0420 02/12/24 1951 02/13/24 0700  BP:   136/81 133/78  Pulse:   (!) 108   Resp: 13 16 14    Temp:   98.1 F (36.7 C) 98.3 F (36.8 C)  TempSrc:   Axillary Axillary  SpO2:      Weight:      Height:        Wt Readings from Last 3 Encounters:  02/11/24 45.4 kg  10/05/21 62.3 kg  09/23/21 64.9 kg     Intake/Output Summary (Last 24 hours) at 02/13/2024 0751 Last data filed at 02/13/2024 0630 Gross per 24 hour  Intake 755.93 ml  Output 300 ml  Net 455.93 ml     Physical Exam  In bed somnolent, minimally responsive but in no distress, positive scleral icterus, mild to moderate ascites, extremely weak and cachectic. No new F.N deficits,  [place].AT, Supple Neck, No JVD,   Symmetrical Chest wall movement, Good air movement bilaterally, CTAB RRR,No Gallops,Rubs or new Murmurs,  +ve B.Sounds, No tenderness,   No Cyanosis, Clubbing or edema       Data Review:    Recent Labs  Lab 02/10/24 2221 02/11/24 0600 02/12/24 0615  WBC 15.5* 15.0* 15.5*  HGB 8.5* 7.4* 6.7*  HCT 24.2* 21.9* 19.0*  PLT 149* 134* 140*  MCV 93.8 99.5 96.4  MCH 32.9 33.6 34.0  MCHC 35.1 33.8 35.3  RDW 18.9* 19.9* 20.0*  LYMPHSABS  --  2.3  --   MONOABS  --  1.3*  --   EOSABS  --  0.0  --   BASOSABS  --  0.0  --     Recent Labs  Lab 02/10/24 2221 02/10/24 2235 02/10/24 2316 02/11/24 0135 02/11/24 0600 02/12/24 0615  NA 127*  --   --   --  129* 130*  K 4.7  --   --   --  4.4 4.0  CL 97*   --   --   --  100 100  CO2 16*  --   --   --  15* 17*  ANIONGAP 14  --   --   --  14 13  GLUCOSE 102*  --   --   --  115* 95  BUN 39*  --   --   --  40* 48*  CREATININE 1.82*  --   --   --  1.73* 1.68*  AST 842*  --   --   --  836* 763*  ALT 161*  --   --   --  149* 135*  ALKPHOS 233*  --   --   --  224* 212*  BILITOT 7.4*  --   --   --  7.7* 7.4*  ALBUMIN 1.5*  --   --   --  2.0* 2.0*  CRP  --   --   --   --   --  8.2*  PROCALCITON  --   --   --   --   --  1.74  LATICACIDVEN  --  5.6*  --  4.1* 4.0*  --   INR  --   --  1.9*  --   --  2.1*  AMMONIA  --   --  24  --   --  28  BNP  --   --   --   --  110.8*  --   MG  --   --   --   --  2.0 1.9  PHOS  --   --   --   --  4.1 3.7  CALCIUM 8.1*  --   --   --  8.1* 8.1*      Recent Labs  Lab 02/10/24 2221 02/10/24 2235 02/10/24 2316 02/11/24 0135 02/11/24 0600 02/12/24 0615  CRP  --   --   --   --   --  8.2*  PROCALCITON  --   --   --   --   --  1.74  LATICACIDVEN  --  5.6*  --  4.1* 4.0*  --   INR  --   --  1.9*  --   --  2.1*  AMMONIA  --   --  24  --   --  28  BNP  --   --   --   --  110.8*  --   MG  --   --   --   --  2.0 1.9  CALCIUM 8.1*  --   --   --  8.1* 8.1*    --------------------------------------------------------------------------------------------------------------- No results found for: CHOL, HDL, LDLCALC, LDLDIRECT, TRIG, CHOLHDL  No results found for: HGBA1C No results for input(s): TSH, T4TOTAL, FREET4, T3FREE, THYROIDAB in the last 72 hours. No results for input(s): VITAMINB12, FOLATE, FERRITIN, TIBC, IRON, RETICCTPCT in the last 72 hours. ------------------------------------------------------------------------------------------------------------------ Cardiac Enzymes No results for input(s): CKMB, TROPONINI, MYOGLOBIN in the last 168 hours.  Invalid input(s): CK  Micro Results Recent Results (from the past 240 hours)  Culture, blood (routine x 2)      Status: None (Preliminary result)   Collection Time: 02/10/24 11:16 PM   Specimen: BLOOD RIGHT ARM  Result Value Ref Range Status   Specimen Description BLOOD RIGHT ARM  Final   Special Requests   Final    BOTTLES DRAWN AEROBIC AND ANAEROBIC Blood Culture adequate volume   Culture   Final    NO GROWTH 3 DAYS Performed at [facility], [address] [place], [place] [address]    Report Status PENDING  Incomplete  Culture, blood (routine x 2)     Status: None (Preliminary result)   Collection Time: 02/10/24 11:18 PM   Specimen: BLOOD LEFT HAND  Result Value Ref Range Status   Specimen Description BLOOD LEFT HAND  Final   Special Requests   Final    BOTTLES DRAWN AEROBIC ONLY Blood Culture adequate volume   Culture   Final    NO GROWTH 3 DAYS Performed at [facility], [address], [place], [place] [address]    Report Status PENDING  Incomplete  MRSA Next Gen by PCR, Nasal     Status: None   Collection Time: 02/12/24  5:22 AM   Specimen: Nasal Mucosa; Nasal Swab  Result Value Ref Range Status   MRSA by PCR Next Gen NOT DETECTED NOT DETECTED Final    Comment: (NOTE) The  GeneXpert MRSA Assay (FDA approved for NASAL specimens only), is one component of a comprehensive MRSA colonization surveillance program. It is not intended to diagnose MRSA infection nor to guide or monitor treatment for MRSA infections. Test performance is not FDA approved in patients less than 11 years old. Performed at [facility], [address], [place], [place] [address]     Radiology Report IR Paracentesis Result Date: 02/11/2024 INDICATION: 78 year old male. History of altered mental status found to have ascites request for diagnostic paracentesis EXAM: ULTRASOUND GUIDED DIAGNOSTIC PARACENTESIS MEDICATIONS: Lidocaine  1% 10 mL COMPLICATIONS: None immediate. PROCEDURE: Informed written consent was obtained from the patient after a discussion of the risks, benefits and alternatives  to treatment. A timeout was performed prior to the initiation of the procedure. Initial ultrasound scanning demonstrates a trace amount of ascites within the right lower abdominal quadrant. The right lower abdomen was prepped and draped in the usual sterile fashion. 1% lidocaine  was used for local anesthesia. Following this, a 19 gauge, 7-cm, Yueh catheter was introduced. An ultrasound image was saved for documentation purposes. The paracentesis was unsuccessful. The catheter was removed and a dressing was applied. The patient tolerated the procedure well without immediate post procedural complication. FINDINGS: Unsuccessful paracentesis IMPRESSION: Unsuccessful ultrasound-guided paracentesis. Performed by Delon Beagle NP Electronically Signed   By: JONETTA Faes M.D.   On: 02/11/2024 15:56   VAS US  LOWER EXTREMITY VENOUS (DVT) Result Date: 02/11/2024  Lower Venous DVT Study Patient Name:  ELFORD EVILSIZER  Date of Exam:   02/11/2024 Medical Rec #: [id number]   Accession #:    [id number] Date of Birth: December 15, 1945   Patient Gender: M Patient Age:   34 years Exam Location:  [facility] Procedure:      VAS US  LOWER EXTREMITY VENOUS (DVT) Referring Phys: TERRY HALL --------------------------------------------------------------------------------  Indications: Swelling.  Risk Factors: None identified. Comparison Study: No prior studies. Performing Technologist: Cordella Collet RVT  Examination Guidelines: A complete evaluation includes B-mode imaging, spectral Doppler, color Doppler, and power Doppler as needed of all accessible portions of each vessel. Bilateral testing is considered an integral part of a complete examination. Limited examinations for reoccurring indications may be performed as noted. The reflux portion of the exam is performed with the patient in reverse Trendelenburg.  +---------+---------------+---------+-----------+----------+--------------+ RIGHT     CompressibilityPhasicitySpontaneityPropertiesThrombus Aging +---------+---------------+---------+-----------+----------+--------------+ CFV      Full           Yes      Yes                                 +---------+---------------+---------+-----------+----------+--------------+ SFJ      Full                                                        +---------+---------------+---------+-----------+----------+--------------+ FV Prox  Full                                                        +---------+---------------+---------+-----------+----------+--------------+ FV Mid   Full                                                        +---------+---------------+---------+-----------+----------+--------------+  FV DistalFull                                                        +---------+---------------+---------+-----------+----------+--------------+ PFV      Full                                                        +---------+---------------+---------+-----------+----------+--------------+ POP      Full           Yes      Yes                                 +---------+---------------+---------+-----------+----------+--------------+ PTV      Full                                                        +---------+---------------+---------+-----------+----------+--------------+ PERO     Full                                                        +---------+---------------+---------+-----------+----------+--------------+   +---------+---------------+---------+-----------+----------+--------------+ LEFT     CompressibilityPhasicitySpontaneityPropertiesThrombus Aging +---------+---------------+---------+-----------+----------+--------------+ CFV      Full           Yes      Yes                                 +---------+---------------+---------+-----------+----------+--------------+ SFJ      Full                                                         +---------+---------------+---------+-----------+----------+--------------+ FV Prox  Full                                                        +---------+---------------+---------+-----------+----------+--------------+ FV Mid   Full                                                        +---------+---------------+---------+-----------+----------+--------------+ FV DistalFull                                                        +---------+---------------+---------+-----------+----------+--------------+  PFV      Full                                                        +---------+---------------+---------+-----------+----------+--------------+ POP      Full           Yes      Yes                                 +---------+---------------+---------+-----------+----------+--------------+ PTV      Full                                                        +---------+---------------+---------+-----------+----------+--------------+ PERO     Full                                                        +---------+---------------+---------+-----------+----------+--------------+     Summary: RIGHT: - There is no evidence of deep vein thrombosis in the lower extremity.  - No cystic structure found in the popliteal fossa.  LEFT: - There is no evidence of deep vein thrombosis in the lower extremity.  - No cystic structure found in the popliteal fossa.  *See table(s) above for measurements and observations. Electronically signed by Lonni Gaskins MD on 02/11/2024 at 1:12:29 PM.    Final    MR ABDOMEN MRCP W WO CONTAST Result Date: 02/11/2024 EXAM: MRCP WITH AND WITHOUT IV CONTRAST 02/11/2024 08:54:00 AM TECHNIQUE: Multisequence, multiplanar magnetic resonance images of the abdomen with and without intravenous contrast. MRCP sequences were performed. 6 mL gadobutrol  (GADAVIST ) 1 MMOL/ML injection 6 mL GADOBUTROL  1 MMOL/ML IV SOLN was administered.  COMPARISON: CT 02/11/2024 and MRI 09/23/2021. CLINICAL HISTORY: Biliary obstruction suspected (Ped 0-17y); 824029 Biliary obstruction (HCC) [id number]. FINDINGS: LIVER: Interval progression of hepatic mass within the right hepatic lobe compared to MRI 2023. The majority of the right hepatic lobe is involved by a confluent infiltrate 11.5 x 10.9 cm mass on image 18 of series 3. There is extension into the left hepatic lobe on the same image. There is a tumor thrombus within the left and right portal veins and extending to the main portal vein. The main portal vein is expanded to 2.9 cm on image 20/series 3 consistent with tumor thrombus. GALLBLADDER AND BILIARY SYSTEM: The gallbladder is normal. There is no intrahepatic biliary ductal dilatation or extrahepatic biliary ductal dilatation. SPLEEN: Unremarkable. PANCREAS/PANCREATIC DUCT: The pancreas is grossly normal. Pancreatic ductal dilatation. ADRENAL GLANDS: Unremarkable. KIDNEYS: Benign nonenhancing cyst of the left kidney measures 15 mm. LYMPH NODES: No enlarged abdominal lymph nodes. 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 PERITONEUM: Large volume of ascites surrounds the liver and spleen. ABDOMINAL WALL: No hernia. No mass. BOWEL: Grossly unremarkable. No bowel obstruction. BONES: No acute abnormality or worrisome osseous lesion. SOFT TISSUES: Unremarkable. MISCELLANEOUS: Unremarkable. IMPRESSION:  1. Interval progression of large right hepatic lobe mass with extension into the left hepatic lobe, compatible with infiltrative malignancy. Finding consistent with hepatocellular carcinoma . 2. 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 Complete occlusion of portal system. 3. Large-volume ascites. 4. No intrahepatic or extrahepatic biliary ductal dilatation.  Electronically signed by: Norleen Boxer MD 02/11/2024 10:35 AM EST RP Workstation: [id number]   MR 3D Recon At Scanner Result Date: 02/11/2024 EXAM: MRCP WITH AND WITHOUT IV CONTRAST 02/11/2024 08:54:00 AM TECHNIQUE: Multisequence, multiplanar magnetic resonance images of the abdomen with and without intravenous contrast. MRCP sequences were performed. 6 mL gadobutrol  (GADAVIST ) 1 MMOL/ML injection 6 mL GADOBUTROL  1 MMOL/ML IV SOLN was administered. COMPARISON: CT 02/11/2024 and MRI 09/23/2021. CLINICAL HISTORY: Biliary obstruction suspected (Ped 0-17y); 824029 Biliary obstruction (HCC) [id number]. FINDINGS: LIVER: Interval progression of hepatic mass within the right hepatic lobe compared to MRI 2023. The majority of the right hepatic lobe is involved by a confluent infiltrate 11.5 x 10.9 cm mass on image 18 of series 3. There is extension into the left hepatic lobe on the same image. There is a tumor thrombus within the left and right portal veins and extending to the main portal vein. The main portal vein is expanded to 2.9 cm on image 20/series 3 consistent with tumor thrombus. GALLBLADDER AND BILIARY SYSTEM: The gallbladder is normal. There is no intrahepatic biliary ductal dilatation or extrahepatic biliary ductal dilatation. SPLEEN: Unremarkable. PANCREAS/PANCREATIC DUCT: The pancreas is grossly normal. Pancreatic ductal dilatation. ADRENAL GLANDS: Unremarkable. KIDNEYS: Benign nonenhancing cyst of the left kidney measures 15 mm. LYMPH NODES: No enlarged abdominal lymph nodes. 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 PERITONEUM: Large volume of ascites surrounds the liver and spleen. ABDOMINAL WALL: No hernia. No mass. BOWEL: Grossly unremarkable. No bowel obstruction. BONES: No acute abnormality or worrisome osseous lesion. SOFT TISSUES: Unremarkable. MISCELLANEOUS: Unremarkable.  IMPRESSION: 1. Interval progression of large right hepatic lobe mass with extension into the left hepatic lobe, compatible with infiltrative malignancy. Finding consistent with hepatocellular carcinoma . 2. 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 Complete occlusion of portal system. 3. Large-volume ascites. 4. No intrahepatic or extrahepatic biliary ductal dilatation. Electronically signed by: Norleen Boxer MD 02/11/2024 10:35 AM EST RP Workstation: [id number]     Signature  -   Lavada Stank M.D on 02/13/2024 at 7:51 AM   -  To page go to [email or web address]

## 2024-02-14 LAB — UREA NITROGEN, URINE: Urea Nitrogen, Ur: 831 mg/dL

## 2024-02-14 NOTE — Progress Notes (Signed)
  Daily Progress Note   Patient Name: John Padilla       Date: 02/14/2024 DOB: February 25, 1946  Age: 78 y.o. MRN#: [id number] Attending Physician: Dennise Lavada POUR, MD Primary Care Physician: Pcp, No Admit Date: 02/10/2024 Length of Stay: 3 days  Reason for Consultation/Follow-up: Establishing goals of care, Non pain symptom management, and Pain control  Subjective:   Subjective: EMR reviewed including review of notes from hospitalist, TOC.  MAR reveals he has had 5 doses of Dilaudid  in the last 24 hours.  He also had 1 dose of lorazepam  at 630 this morning.  I saw and examined patient today.  No family is present at the bedside on my encounter.  He was lying in bed in no distress and did not awaken during encounter.  Bedside staff report overall he has been resting fairly comfortably and no concerns about his comfort at this time.  He is not able to give any history as he is actively dying and somnolent.   Review of Systems Unable to obtain Objective:   Vital Signs:  BP 137/74 (BP Location: Left Wrist)   Pulse (!) 105   Temp (!) 100.5 F (38.1 C) (Axillary)   Resp 10   Ht 5' 2 (1.575 m)   Wt 45.4 kg   SpO2 98%   BMI 18.29 kg/m   Physical Exam: General: Somnolent, eyes closed today, HENT: Tacky mucous membranes, slight hyperextension  cardiovascular: Tachycardic, pulses palpable radially and in feet but diminished from yesterday.   Respiratory: Cheyne-Stokes breathing, no distress, poor air movement  Abdomen: distended Extremities: Lower extremity edema, vitiligo Neuro: Unresponsive  Imaging: @IMAGES @  I personally reviewed recent imaging.   Assessment & Plan:   Assessment: 78 year old male with chronic hepatitis C, hepatocellular carcinoma, vitiligo, anemia of chronic disease who is actively dying secondary to sepsis from intra-abdominal process  Recommendations/Plan: # Complex medical decision making/goals of care:  - Comfort care only moving forward.  Medications  reviewed in detail and will continue to require injectable opioids and benzodiazepines for his comfort moving forward.  - Discussed with Dr. Dennise  -  Code Status: Limited: Do not attempt resuscitation (DNR) -DNR-LIMITED -Do Not Intubate/DNI   Prognosis: Hours - Days  # Symptom management: Patient is receiving these palliative interventions for symptom management with an intent to improve quality of life.   - Pain: Hydromorphone  1 mg every 2 hours as needed.  He has required 5 doses in the last 24 hours.  If trend continues, would consider scheduling this every 4 hours around-the-clock  - Anxiety: 0.5 mg every 4 hours as needed.  1 dose so far.  Continue to monitor  - Excess secretions: Glycopyrrolate as needed  - Nausea: Compazine as needed  # Discharge Planning: Anticipated Hospital Death  -  Discussed with: Dr. Dennise  Thank you for allowing the palliative care team to participate in the care John Padilla.  Amaryllis Meissner, MD Palliative Care Provider PMT # [phone]  If patient remains symptomatic despite maximum doses, please call PMT at [phone] between 0700 and 1900. Outside of these hours, please call attending, as PMT does not have night coverage.  Billing based on MDM: High  Problems Addressed: One acute or chronic illness or injury that poses a threat to life or bodily function  Amount and/or Complexity of Data: Category 1:Review of prior external note(s) from each unique source and Assessment requiring an independent historian(s)  Risks: Parenteral controlled substances

## 2024-02-14 NOTE — Progress Notes (Signed)
 PROGRESS NOTE                                                                                                                                                                                                             Patient Demographics:    John Padilla, is a 78 y.o. male, DOB - [birth year], FMW:[id number]  Outpatient Primary MD for the patient is Pcp, No    LOS - 3  Admit date - 02/10/2024    Chief Complaint  Patient presents with   Fatigue       Brief Narrative (HPI from H&P)   78 y.o. male with medical history significant for chronic hepatitis C, hepatocellular carcinoma, vitiligo, anemia of chronic disease, ambulatory dysfunction, uses a walker at baseline, who presents to the ER from home due to lethargy, altered mental status, and generalized weakness.  The patient was diagnosed with hepatocellular carcinoma 2 years ago and opted out of treatment.  He lives alone.  Has not seen a medical provider since his diagnosis 2 years ago.  A few days ago, he was noted to have a significant decline in his functional status.  Associated with abdominal distention, poor oral intake, and bilateral lower extremity edema, left greater than right.  His daughter brought him to her home on Wednesday 02/06/24.  However, he has not been eating or drinking adequately.  Today, he only took 2 bites of his meal.   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   CT chest abdomen pelvis with contrast revealed large, markedly complex right hepatic mass with multiple cystic areas, measuring 12.7 x 9.7 cm.  This could reflect abscess or malignancy.  Marked dilation of the intrahepatic and extrahepatic bile ducts with a  common bile duct measuring up to 15 mm.  Moderate to large volume ascites throughout the abdomen and pelvis.  Recommend further evaluation of the above findings with MRI abdomen with contrast and MRCP.    Subjective:    Nahun Kronberg today in bed, in no distress but more somnolent this morning.   Assessment  & Plan :   Underlying history of chronic hepatitis C, known history of  hepatocellular carcinoma, refused treatment several years ago.  Now severe extension of hepatocellular carcinoma, now extending into the portal veins, patient did not intend to get any treatment few years ago now in extremely guarded condition.  Presenting with acute metabolic encephalopathy, due to possible SBP however   Prognosis extremely poor, he has advanced known hepatocellular carcinoma for which he did not want treatment 2 years ago, case discussed with GI physician Dr. Albertus on 02/12/2024, patient will be best served with comfort measures, had extensive discussion with patient's daughter and son-in-law who now agree with it.  Patient is DNR, full care will be directed on keeping him comfortable, they want antibiotics to be continued for a few days for clinical diagnosis of SBP which I will continue.  Palliative care will be involved, all care will be directed towards keeping him comfortable.  He is now on full comfort care.  Expect him to pass away in the next few days.   Sepsis secondary to suspected intra-abdominal infection, possible acute cholangitis/possible SBP, POA Likely due to SBP, continue empiric antibiotics for a few days per family wishes, goal of care is now comfort.   Chronic hepatitis C, hepatocellular carcinoma Used treatment several years ago, was previously seen by Dr. Lanny.  Now comfort measures.   Large ascites secondary to the above Ultrasound-guided paracentesis failed, supportive care.   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  Daughter and wife bedside 02/13/2024, daughter bedside 02/14/2024  Code Status : DNR  Consults  : Case discussed with GI Dr. Albertus over the phone on 02/12/2024, palliative care on board  PUD Prophylaxis :    Procedures  :     CT Head - Non acute  MRCP -  1. Interval progression of large right hepatic lobe mass with extension into the left hepatic lobe, compatible with infiltrative malignancy. Finding consistent with hepatocellular carcinoma . 2. 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 Complete occlusion of portal system. 3. Large-volume ascites. 4. No intrahepatic or extrahepatic biliary ductal dilatation.      Disposition Plan  :    Status is: Inpatient  DVT Prophylaxis  :    SCDs Start: 02/11/24 0211    Lab Results  Component Value Date   PLT 140 (L) 02/12/2024    Diet :  Diet Order             DIET SOFT Fluid consistency: Thin  Diet effective now                    Inpatient Medications  Scheduled Meds: Continuous Infusions:  cefTRIAXone (ROCEPHIN)  IV 2 g (02/14/24 0143)   metronidazole 500  mg (02/13/24 2024)    PRN Meds:.glycopyrrolate, HYDROmorphone  (DILAUDID ) injection, LORazepam , oxyCODONE , polyethylene glycol, prochlorperazine  Antibiotics  :    Anti-infectives (From admission, onward)    Start     Dose/Rate Route Frequency Ordered Stop   02/11/24 0230  cefTRIAXone (ROCEPHIN) 2 g in sodium chloride  0.9 % 100 mL IVPB        2 g 200 mL/hr over 30 Minutes Intravenous Every 24 hours 02/11/24 0226     02/11/24 0230  metroNIDAZOLE (FLAGYL) IVPB 500 mg        500 mg 100 mL/hr over 60 Minutes Intravenous Every 12 hours 02/11/24 0226           Objective:   Vitals:   02/12/24 0420 02/12/24 1951 02/13/24 0700 02/13/24 1927  BP:  136/81 133/78 137/74  Pulse:  (!) 108  (!) 105  Resp: 16 14  10   Temp:  98.1 F (36.7 C) 98.3 F (36.8 C) 99.6 F (37.6 C)  TempSrc:  Axillary Axillary Axillary  SpO2:      Weight:      Height:        Wt Readings from Last 3 Encounters:  02/11/24 45.4 kg  10/05/21 62.3 kg  09/23/21 64.9 kg     Intake/Output Summary (Last 24 hours) at 02/14/2024 0725 Last data filed at 02/14/2024 9365 Gross per 24 hour  Intake --  Output 500 ml  Net -500 ml     Physical Exam  In bed somnolent, minimally responsive but in no distress, positive scleral icterus, mild to moderate ascites, extremely weak and cachectic. No new F.N deficits,  [place].AT, Supple Neck, No JVD,   Symmetrical Chest wall movement, Good air movement bilaterally, CTAB RRR,No Gallops,Rubs or new Murmurs,  +ve B.Sounds, No tenderness,   No Cyanosis, Clubbing or edema       Data Review:    Recent Labs  Lab 02/10/24 2221 02/11/24 0600 02/12/24 0615  WBC 15.5* 15.0* 15.5*  HGB 8.5* 7.4* 6.7*  HCT 24.2* 21.9* 19.0*  PLT 149* 134* 140*  MCV 93.8 99.5 96.4  MCH 32.9 33.6 34.0  MCHC 35.1 33.8 35.3  RDW 18.9* 19.9* 20.0*  LYMPHSABS  --  2.3  --   MONOABS  --  1.3*  --   EOSABS  --  0.0  --   BASOSABS  --  0.0  --     Recent Labs  Lab 02/10/24 2221 02/10/24 2235 02/10/24 2316  02/11/24 0135 02/11/24 0600 02/12/24 0615  NA 127*  --   --   --  129* 130*  K 4.7  --   --   --  4.4 4.0  CL 97*  --   --   --  100 100  CO2 16*  --   --   --  15* 17*  ANIONGAP 14  --   --   --  14 13  GLUCOSE 102*  --   --   --  115* 95  BUN 39*  --   --   --  40* 48*  CREATININE 1.82*  --   --   --  1.73* 1.68*  AST 842*  --   --   --  836* 763*  ALT 161*  --   --   --  149* 135*  ALKPHOS 233*  --   --   --  224* 212*  BILITOT 7.4*  --   --   --  7.7* 7.4*  ALBUMIN 1.5*  --   --   --  2.0* 2.0*  CRP  --   --   --   --   --  8.2*  PROCALCITON  --   --   --   --   --  1.74  LATICACIDVEN  --  5.6*  --  4.1* 4.0*  --   INR  --   --  1.9*  --   --  2.1*  AMMONIA  --   --  24  --   --  28  BNP  --   --   --   --  110.8*  --   MG  --   --   --   --  2.0 1.9  PHOS  --   --   --   --  4.1 3.7  CALCIUM 8.1*  --   --   --  8.1* 8.1*      Recent Labs  Lab 02/10/24 2221 02/10/24 2235 02/10/24 2316 02/11/24 0135 02/11/24 0600 02/12/24 0615  CRP  --   --   --   --   --  8.2*  PROCALCITON  --   --   --   --   --  1.74  LATICACIDVEN  --  5.6*  --  4.1* 4.0*  --   INR  --   --  1.9*  --   --  2.1*  AMMONIA  --   --  24  --   --  28  BNP  --   --   --   --  110.8*  --   MG  --   --   --   --  2.0 1.9  CALCIUM 8.1*  --   --   --  8.1* 8.1*    --------------------------------------------------------------------------------------------------------------- No results found for: CHOL, HDL, LDLCALC, LDLDIRECT, TRIG, CHOLHDL  No results found for: HGBA1C No results for input(s): TSH, T4TOTAL, FREET4, T3FREE, THYROIDAB in the last 72 hours. No results for input(s): VITAMINB12, FOLATE, FERRITIN, TIBC, IRON, RETICCTPCT in the last 72 hours. ------------------------------------------------------------------------------------------------------------------ Cardiac Enzymes No results for input(s): CKMB, TROPONINI, MYOGLOBIN in the last 168  hours.  Invalid input(s): CK  Micro Results Recent Results (from the past 240 hours)  Culture, blood (routine x 2)     Status: None (Preliminary result)   Collection Time: 02/10/24 11:16 PM   Specimen: BLOOD RIGHT ARM  Result Value Ref Range Status   Specimen Description BLOOD RIGHT ARM  Final   Special Requests   Final    BOTTLES DRAWN AEROBIC AND ANAEROBIC Blood Culture adequate volume   Culture   Final    NO GROWTH 4 DAYS Performed at [facility], [address] [place], [place] [address]    Report Status PENDING  Incomplete  Culture, blood (routine x 2)     Status: None (Preliminary result)   Collection Time: 02/10/24 11:18 PM   Specimen: BLOOD LEFT HAND  Result Value Ref Range Status   Specimen Description BLOOD LEFT HAND  Final   Special Requests   Final    BOTTLES DRAWN AEROBIC ONLY Blood Culture adequate volume   Culture   Final    NO GROWTH 4 DAYS Performed at [facility], [address] [place], [place] [address]    Report Status PENDING  Incomplete  MRSA Next Gen by PCR, Nasal     Status: None   Collection Time: 02/12/24  5:22 AM   Specimen: Nasal Mucosa; Nasal Swab  Result Value Ref Range  Status   MRSA by PCR Next Gen NOT DETECTED NOT DETECTED Final    Comment: (NOTE) The GeneXpert MRSA Assay (FDA approved for NASAL specimens only), is one component of a comprehensive MRSA colonization surveillance program. It is not intended to diagnose MRSA infection nor to guide or monitor treatment for MRSA infections. Test performance is not FDA approved in patients less than 72 years old. Performed at [facility], [address] [place], [place] [address]     Radiology Report No results found.    Signature  -   Lavada Stank M.D on 02/14/2024 at 7:25 AM   -  To page go to [email or web address]

## 2024-02-15 LAB — CULTURE, BLOOD (ROUTINE X 2)
Culture: NO GROWTH
Culture: NO GROWTH
Special Requests: ADEQUATE
Special Requests: ADEQUATE

## 2024-02-15 NOTE — Progress Notes (Signed)
 Daily Progress Note   Patient Name: John Padilla       Date: 02/15/2024 DOB: 18-May-1945  Age: 78 y.o. MRN#: [id number] Attending Physician: Dennise Lavada POUR, MD Primary Care Physician: Pcp, No Admit Date: 02/10/2024  Reason for Consultation/Follow-up: Establishing goals of care  Length of Stay: 4  Current Medications: Scheduled Meds:    Continuous Infusions:  cefTRIAXone (ROCEPHIN)  IV 2 g (02/15/24 0132)   metronidazole 100 mL/hr at 02/15/24 0758    PRN Meds: glycopyrrolate, HYDROmorphone  (DILAUDID ) injection, LORazepam , oxyCODONE , polyethylene glycol, prochlorperazine  Physical Exam Vitals reviewed.  Constitutional:      General: He is not in acute distress.    Appearance: He is ill-appearing.  HENT:     Head: Normocephalic and atraumatic.  Pulmonary:     Effort: Pulmonary effort is normal.  Skin:    General: Skin is warm and dry.  Neurological:     Mental Status: He is alert.             Vital Signs: BP 121/63 (BP Location: Left Arm)   Pulse (!) 110   Temp 99.7 F (37.6 C) (Axillary)   Resp 10   Ht 5' 2 (1.575 m)   Wt 45.4 kg   SpO2 98%   BMI 18.29 kg/m  SpO2: SpO2: 98 % O2 Device: O2 Device: Room Air O2 Flow Rate:    Intake/output summary:  Intake/Output Summary (Last 24 hours) at 02/15/2024 0957 Last data filed at 02/15/2024 0758 Gross per 24 hour  Intake 100 ml  Output --  Net 100 ml   LBM: Last BM Date : 02/10/24 Baseline Weight: Weight: 45.4 kg Most recent weight: Weight: 45.4 kg       Palliative Assessment/Data:      Patient Active Problem List   Diagnosis Date Noted   AMS (altered mental status) 02/11/2024   Chronic hepatitis C without hepatic coma (HCC) 10/07/2021   Hepatocellular carcinoma (HCC) 10/05/2021   Toxic encephalopathy  09/24/2021   Acute blood loss anemia 09/24/2021   Closed displaced intertrochanteric fracture of right femur (HCC) 09/22/2021   Elevated liver enzymes 09/22/2021   Hyperbilirubinemia 09/22/2021   Coagulopathy 09/22/2021   Thrombocytopenia 09/22/2021   Alcohol use 09/22/2021   Hepatic steatosis 09/22/2021   Vitiligo 09/22/2021   Rhabdomyolysis 09/22/2021   Hyponatremia 09/22/2021  Normocytic anemia 09/22/2021    Palliative Care Assessment & Plan   Patient Profile: 79 year old male with chronic hepatitis C, hepatocellular carcinoma, vitiligo, anemia of chronic disease who is actively dying secondary to sepsis from intra-abdominal process   Today's Discussion: Reviewed chart. Patient received prn dilaudid  twice and prn ativan  once over the last 24 hours. Patient lying in bed. He looks comfortable and is in no apparent distress. His breathing is even and unlabored. He does not awaken when I enter the room. No family at bedside.  Recommendations/Plan: Full comfort measures Symptom management per [facility] Anticipated hospital death Continued PMT support    Code Status:    Code Status Orders  (From admission, onward)           Start     Ordered   02/11/24 0352  Do not attempt resuscitation (DNR)- Limited -Do Not Intubate (DNI)  (Code Status)  Continuous       Question Answer Comment  If pulseless and not breathing No CPR or chest compressions.   In Pre-Arrest Conditions (Patient Is Breathing and Has A Pulse) Do not intubate. Provide all appropriate non-invasive medical interventions. Avoid ICU transfer unless indicated or required.   Consent: Discussion documented in EHR or advanced directives reviewed      02/11/24 0351         Extensive chart review has been completed prior to seeing the patient including vital signs, progress/consult notes, orders, medications, and available advance directive documents.  Care plan was discussed with bedside RN  Time spent: 25  minutes  Thank you for allowing the Palliative Medicine Team to assist in the care of this patient.    Stephane CHRISTELLA Palin, NP  Please contact Palliative Medicine Team phone at [phone] for questions and concerns.

## 2024-02-15 NOTE — Plan of Care (Signed)
  Problem: Education: Goal: Knowledge of General Education information will improve Description: Including pain rating scale, medication(s)/side effects and non-pharmacologic comfort measures Outcome: Progressing   Problem: Health Behavior/Discharge Planning: Goal: Ability to manage health-related needs will improve Outcome: Progressing   Problem: Pain Managment: Goal: General experience of comfort will improve and/or be controlled Outcome: Progressing

## 2024-02-15 NOTE — Progress Notes (Signed)
 PROGRESS NOTE                                                                                                                                                                                                             Patient Demographics:    John Padilla, is a 78 y.o. male, DOB - 1945/11/23, FMW:[id number]  Outpatient Primary MD for the patient is Pcp, No    LOS - 4  Admit date - 02/10/2024    Chief Complaint  Patient presents with   Fatigue       Brief Narrative (HPI from H&P)   78 y.o. male with medical history significant for chronic hepatitis C, hepatocellular carcinoma, vitiligo, anemia of chronic disease, ambulatory dysfunction, uses a walker at baseline, who presents to the ER from home due to lethargy, altered mental status, and generalized weakness.  The patient was diagnosed with hepatocellular carcinoma 2 years ago and opted out of treatment.  He lives alone.  Has not seen a medical provider since his diagnosis 2 years ago.  A few days ago, he was noted to have a significant decline in his functional status.  Associated with abdominal distention, poor oral intake, and bilateral lower extremity edema, left greater than right.  His daughter brought him to her home on Wednesday 02/06/24.  However, he has not been eating or drinking adequately.  Today, he only took 2 bites of his meal.   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   CT chest abdomen pelvis with contrast revealed large, markedly complex right hepatic mass with multiple cystic areas, measuring 12.7 x 9.7 cm.  This could reflect abscess or malignancy.  Marked dilation of the intrahepatic and extrahepatic bile ducts with a  common bile duct measuring up to 15 mm.  Moderate to large volume ascites throughout the abdomen and pelvis.  Recommend further evaluation of the above findings with MRI abdomen with contrast and MRCP.    Subjective:    John Padilla today in bed, in no distress but more somnolent this morning.   Assessment  & Plan :   Underlying history of chronic hepatitis C, known history of  hepatocellular carcinoma, refused treatment several years ago.  Now severe extension of hepatocellular carcinoma, now extending into the portal veins, patient did not intend to get any treatment few years ago now in extremely guarded condition.  Presenting with acute metabolic encephalopathy, due to possible SBP however   Prognosis extremely poor, he has advanced known hepatocellular carcinoma for which he did not want treatment 2 years ago, case discussed with GI physician Dr. Albertus on 02/12/2024, patient will be best served with comfort measures, had extensive discussion with patient's daughter and son-in-law who now agree with it.  Patient is DNR, full care will be directed on keeping him comfortable, they want antibiotics to be continued for a few days for clinical diagnosis of SBP which I will continue.  Palliative care will be involved, all care will be directed towards keeping him comfortable.  He is now on full comfort care.  Expect him to pass away in the next few days.   Sepsis secondary to suspected intra-abdominal infection, possible acute cholangitis/possible SBP, POA Likely due to SBP, continue empiric antibiotics for a few days per family wishes, goal of care is now comfort.   Chronic hepatitis C, hepatocellular carcinoma Used treatment several years ago, was previously seen by Dr. Lanny.  Now comfort measures.   Large ascites secondary to the above Ultrasound-guided paracentesis failed, supportive care.   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  Daughter and wife bedside 02/13/2024, daughter bedside 02/14/2024  Code Status : DNR  Consults  : Case discussed with GI Dr. Albertus over the phone on 02/12/2024, palliative care on board  PUD Prophylaxis :    Procedures  :     CT Head - Non acute  MRCP -  1. Interval progression of large right hepatic lobe mass with extension into the left hepatic lobe, compatible with infiltrative malignancy. Finding consistent with hepatocellular carcinoma . 2. 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 Complete occlusion of portal system. 3. Large-volume ascites. 4. No intrahepatic or extrahepatic biliary ductal dilatation.      Disposition Plan  :    Status is: Inpatient  DVT Prophylaxis  :    SCDs Start: 02/11/24 0211    Lab Results  Component Value Date   PLT 140 (L) 02/12/2024    Diet :  Diet Order             DIET SOFT Fluid consistency: Thin  Diet effective now                    Inpatient Medications  Scheduled Meds: Continuous Infusions:  cefTRIAXone (ROCEPHIN)  IV 2 g (02/15/24 0132)   metronidazole 100  mL/hr at 02/15/24  0758   PRN Meds:.glycopyrrolate, HYDROmorphone  (DILAUDID ) injection, LORazepam , oxyCODONE , polyethylene glycol, prochlorperazine  Antibiotics  :    Anti-infectives (From admission, onward)    Start     Dose/Rate Route Frequency Ordered Stop   02/11/24 0230  cefTRIAXone (ROCEPHIN) 2 g in sodium chloride  0.9 % 100 mL IVPB        2 g 200 mL/hr over 30 Minutes Intravenous Every 24 hours 02/11/24 0226     02/11/24 0230  metroNIDAZOLE (FLAGYL) IVPB 500 mg        500 mg 100 mL/hr over 60 Minutes Intravenous Every 12 hours 02/11/24 0226           Objective:   Vitals:   02/14/24 0727 02/14/24 1949 02/15/24 0001 02/15/24 0829  BP:  122/64  121/63  Pulse:  (!) 101  (!) 110  Resp:  10    Temp: (!) 100.5 F (38.1 C) (!) 100.6 F (38.1 C) 99.8 F (37.7 C) 99.7 F (37.6 C)  TempSrc: Axillary Axillary Axillary Axillary  SpO2:      Weight:      Height:        Wt Readings from Last 3 Encounters:  02/11/24 45.4 kg  10/05/21 62.3 kg  09/23/21 64.9 kg     Intake/Output Summary (Last 24 hours) at 02/15/2024 0847 Last data filed at 02/15/2024 0758 Gross per 24 hour  Intake 100 ml  Output --  Net 100 ml     Physical Exam  In bed somnolent, minimally responsive but in no distress, positive scleral icterus, mild to moderate ascites, extremely weak and cachectic. No new F.N deficits,  [place].AT, Supple Neck, No JVD,   Symmetrical Chest wall movement, Good air movement bilaterally, CTAB RRR,No Gallops,Rubs or new Murmurs,  +ve B.Sounds, No tenderness,   No Cyanosis, Clubbing or edema       Data Review:    Recent Labs  Lab 02/10/24 2221 02/11/24 0600 02/12/24 0615  WBC 15.5* 15.0* 15.5*  HGB 8.5* 7.4* 6.7*  HCT 24.2* 21.9* 19.0*  PLT 149* 134* 140*  MCV 93.8 99.5 96.4  MCH 32.9 33.6 34.0  MCHC 35.1 33.8 35.3  RDW 18.9* 19.9* 20.0*  LYMPHSABS  --  2.3  --   MONOABS  --  1.3*  --   EOSABS  --  0.0  --   BASOSABS  --  0.0  --     Recent Labs  Lab 02/10/24 2221  02/10/24 2235 02/10/24 2316 02/11/24 0135 02/11/24 0600 02/12/24 0615  NA 127*  --   --   --  129* 130*  K 4.7  --   --   --  4.4 4.0  CL 97*  --   --   --  100 100  CO2 16*  --   --   --  15* 17*  ANIONGAP 14  --   --   --  14 13  GLUCOSE 102*  --   --   --  115* 95  BUN 39*  --   --   --  40* 48*  CREATININE 1.82*  --   --   --  1.73* 1.68*  AST 842*  --   --   --  836* 763*  ALT 161*  --   --   --  149* 135*  ALKPHOS 233*  --   --   --  224* 212*  BILITOT 7.4*  --   --   --  7.7* 7.4*  ALBUMIN 1.5*  --   --   --  2.0* 2.0*  CRP  --   --   --   --   --  8.2*  PROCALCITON  --   --   --   --   --  1.74  LATICACIDVEN  --  5.6*  --  4.1* 4.0*  --   INR  --   --  1.9*  --   --  2.1*  AMMONIA  --   --  24  --   --  28  BNP  --   --   --   --  110.8*  --   MG  --   --   --   --  2.0 1.9  PHOS  --   --   --   --  4.1 3.7  CALCIUM 8.1*  --   --   --  8.1* 8.1*      Recent Labs  Lab 02/10/24 2221 02/10/24 2235 02/10/24 2316 02/11/24 0135 02/11/24 0600 02/12/24 0615  CRP  --   --   --   --   --  8.2*  PROCALCITON  --   --   --   --   --  1.74  LATICACIDVEN  --  5.6*  --  4.1* 4.0*  --   INR  --   --  1.9*  --   --  2.1*  AMMONIA  --   --  24  --   --  28  BNP  --   --   --   --  110.8*  --   MG  --   --   --   --  2.0 1.9  CALCIUM 8.1*  --   --   --  8.1* 8.1*    --------------------------------------------------------------------------------------------------------------- No results found for: CHOL, HDL, LDLCALC, LDLDIRECT, TRIG, CHOLHDL  No results found for: HGBA1C No results for input(s): TSH, T4TOTAL, FREET4, T3FREE, THYROIDAB in the last 72 hours. No results for input(s): VITAMINB12, FOLATE, FERRITIN, TIBC, IRON, RETICCTPCT in the last 72 hours. ------------------------------------------------------------------------------------------------------------------ Cardiac Enzymes No results for input(s): CKMB, TROPONINI,  MYOGLOBIN in the last 168 hours.  Invalid input(s): CK  Micro Results Recent Results (from the past 240 hours)  Culture, blood (routine x 2)     Status: None   Collection Time: 02/10/24 11:16 PM   Specimen: BLOOD RIGHT ARM  Result Value Ref Range Status   Specimen Description BLOOD RIGHT ARM  Final   Special Requests   Final    BOTTLES DRAWN AEROBIC AND ANAEROBIC Blood Culture adequate volume   Culture   Final    NO GROWTH 5 DAYS Performed at [facility], [address] [place], [place] [address]    Report Status 02/15/2024 FINAL  Final  Culture, blood (routine x 2)     Status: None   Collection Time: 02/10/24 11:18 PM   Specimen: BLOOD LEFT HAND  Result Value Ref Range Status   Specimen Description BLOOD LEFT HAND  Final   Special Requests   Final    BOTTLES DRAWN AEROBIC ONLY Blood Culture adequate volume   Culture   Final    NO GROWTH 5 DAYS Performed at [facility], [address], [place], [place] [address]    Report Status 02/15/2024 FINAL  Final  MRSA Next Gen by PCR, Nasal     Status: None   Collection Time: 02/12/24  5:22 AM  Specimen: Nasal Mucosa; Nasal Swab  Result Value Ref Range Status   MRSA by PCR Next Gen NOT DETECTED NOT DETECTED Final    Comment: (NOTE) The GeneXpert MRSA Assay (FDA approved for NASAL specimens only), is one component of a comprehensive MRSA colonization surveillance program. It is not intended to diagnose MRSA infection nor to guide or monitor treatment for MRSA infections. Test performance is not FDA approved in patients less than 19 years old. Performed at [facility], [address], [place], [place] [address]     Radiology Report No results found.    Signature  -   Lavada Stank M.D on 02/15/2024 at 8:47 AM   -  To page go to [email or web address]

## 2024-02-15 NOTE — Progress Notes (Addendum)
 PT Cancellation Note  Patient Details Name: Mong Neal MRN: [id number] DOB: [birth year]   Cancelled Treatment:    Reason Eval/Treat Not Completed: (P) Other (comment). Per chart and discussion with pt's family, pt is now on full comfort measures and it seems he has a poor prognosis and likely to pass away while in the hospital. Asked family if there was anything they needed from PT at this time. They requested advice on elevating his L UE to reduce swelling in his upper arm. Provided education on positioning a pillow under the arm and also on regularly rolling and repositioning pt for pressure relief and prevention of discomfort. They expressed gratitude but no further questions or needs for PT at this time. PT will sign off.    Theo Ferretti, PT, DPT Acute Rehabilitation Services  Office: [phone]   Theo CHRISTELLA Ferretti 02/15/2024, 8:05 AM

## 2024-02-16 MED ORDER — BACITRACIN ZINC 500 UNIT/GM EX OINT
TOPICAL_OINTMENT | CUTANEOUS | Status: DC | PRN
  Administered 2024-02-16: 31.5 via TOPICAL
  Filled 2024-02-16: qty 28.4

## 2024-02-16 NOTE — Progress Notes (Addendum)
 Daily Progress Note   Patient Name: John Padilla       Date: 02/16/2024 DOB: 1945-11-09  Age: 78 y.o. MRN#: [id number] Attending Physician: Dennise Lavada POUR, MD Primary Care Physician: Pcp, No Admit Date: 02/10/2024  Reason for Consultation/Follow-up: Establishing goals of care  Length of Stay: 5  Current Medications: Scheduled Meds:    Continuous Infusions:  cefTRIAXone (ROCEPHIN)  IV 2 g (02/15/24 0132)   metronidazole 500 mg (02/15/24 1147)    PRN Meds: glycopyrrolate, HYDROmorphone  (DILAUDID ) injection, LORazepam , oxyCODONE , polyethylene glycol, prochlorperazine  Physical Exam Vitals reviewed.  Constitutional:      General: He is not in acute distress.    Appearance: He is cachectic. He is ill-appearing.  HENT:     Head: Normocephalic and atraumatic.     Mouth/Throat:     Mouth: Mucous membranes are dry.  Cardiovascular:     Rate and Rhythm: Normal rate.  Pulmonary:     Effort: Tachypnea present.  Skin:    General: Skin is warm and dry.             Vital Signs: BP 109/62 (BP Location: Left Arm)   Pulse 99   Temp 99.8 F (37.7 C) (Axillary)   Resp (!) 9   Ht 5' 2 (1.575 m)   Wt 45.4 kg   SpO2 98%   BMI 18.29 kg/m  SpO2: SpO2: 98 % O2 Device: O2 Device: Room Air O2 Flow Rate:          Palliative Assessment/Data: 10%      Patient Active Problem List   Diagnosis Date Noted   AMS (altered mental status) 02/11/2024   Chronic hepatitis C without hepatic coma (HCC) 10/07/2021   Hepatocellular carcinoma (HCC) 10/05/2021   Toxic encephalopathy 09/24/2021   Acute blood loss anemia 09/24/2021   Closed displaced intertrochanteric fracture of right femur (HCC) 09/22/2021   Elevated liver enzymes 09/22/2021   Hyperbilirubinemia 09/22/2021   Coagulopathy  09/22/2021   Thrombocytopenia 09/22/2021   Alcohol use 09/22/2021   Hepatic steatosis 09/22/2021   Vitiligo 09/22/2021   Rhabdomyolysis 09/22/2021   Hyponatremia 09/22/2021   Normocytic anemia 09/22/2021    Palliative Care Assessment & Plan   Patient Profile: 78 year old male with chronic hepatitis C, hepatocellular carcinoma, vitiligo, anemia of chronic disease who is actively dying secondary to sepsis from  intra-abdominal process   Today's Discussion: Reviewed chart. Patient received prn dilaudid  once and prn ativan  twice over the last 24 hours. Patient lying in bed. He does not awaken when I enter the room. His breathing is tachypneic. Requested prn dilaudid  from nursing.  Patient's daughter John Padilla is at bedside. We discussed plan to manage end of life care in hospital. Discussed available comfort medications ane encouraged daughter to request these if the patient looked uncomfortable.  Emotional support provided. Encouraged family to call PMT with questions or concerns. PMT will continue to follow.  Recommendations/Plan: Full comfort measures Symptom management per [facility] Anticipated hospital death Continued PMT support    Code Status:    Code Status Orders  (From admission, onward)           Start     Ordered   02/11/24 0352  Do not attempt resuscitation (DNR)- Limited -Do Not Intubate (DNI)  (Code Status)  Continuous       Question Answer Comment  If pulseless and not breathing No CPR or chest compressions.   In Pre-Arrest Conditions (Patient Is Breathing and Has A Pulse) Do not intubate. Provide all appropriate non-invasive medical interventions. Avoid ICU transfer unless indicated or required.   Consent: Discussion documented in EHR or advanced directives reviewed      02/11/24 0351         Extensive chart review has been completed prior to seeing the patient including vital signs, progress/consult notes, orders, medications, and available advance directive  documents.  Care plan was discussed with bedside RN  Time spent: 25 minutes  Thank you for allowing the Palliative Medicine Team to assist in the care of this patient.    Stephane CHRISTELLA Palin, NP  Please contact Palliative Medicine Team phone at [phone] for questions and concerns.

## 2024-02-16 NOTE — Progress Notes (Signed)
 PROGRESS NOTE                                                                                                                                                                                                             Patient Demographics:    John Padilla, is a 78 y.o. male, DOB - 1946-02-27, FMW:[id number]  Outpatient Primary MD for the patient is Pcp, No    LOS - 5  Admit date - 02/10/2024    Chief Complaint  Patient presents with   Fatigue       Brief Narrative (HPI from H&P)   78 y.o. male with medical history significant for chronic hepatitis C, hepatocellular carcinoma, vitiligo, anemia of chronic disease, ambulatory dysfunction, uses a walker at baseline, who presents to the ER from home due to lethargy, altered mental status, and generalized weakness.  The patient was diagnosed with hepatocellular carcinoma 2 years ago and opted out of treatment.  He lives alone.  Has not seen a medical provider since his diagnosis 2 years ago.  A few days ago, he was noted to have a significant decline in his functional status.  Associated with abdominal distention, poor oral intake, and bilateral lower extremity edema, left greater than right.  His daughter brought him to her home on Wednesday 02/06/24.  However, he has not been eating or drinking adequately.  Today, he only took 2 bites of his meal.   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   CT chest abdomen pelvis with contrast revealed large, markedly complex right hepatic mass with multiple cystic areas, measuring 12.7 x 9.7 cm.  This could reflect abscess or malignancy.  Marked dilation of the intrahepatic and extrahepatic bile ducts with a  common bile duct measuring up to 15 mm.  Moderate to large volume ascites throughout the abdomen and pelvis.  Recommend further evaluation of the above findings with MRI abdomen with contrast and MRCP.    Subjective:    John Padilla today in bed, in no distress but more somnolent this morning.   Assessment  & Plan :   Underlying history of chronic hepatitis C, known history of  hepatocellular carcinoma, refused treatment several years ago.  Now severe extension of hepatocellular carcinoma, now extending into the portal veins, patient did not intend to get any treatment few years ago now in extremely guarded condition.  Presenting with acute metabolic encephalopathy, due to possible SBP however   Prognosis extremely poor, he has advanced known hepatocellular carcinoma for which he did not want treatment 2 years ago, case discussed with GI physician Dr. Albertus on 02/12/2024, patient will be best served with comfort measures, had extensive discussion with patient's daughter and son-in-law who now agree with it.  Patient is DNR, full care will be directed on keeping him comfortable, they want antibiotics to be continued for a few days for clinical diagnosis of SBP which I will continue.  Palliative care will be involved, all care will be directed towards keeping him comfortable.  He is now on full comfort care.  Expect him to pass away in the next few days.   Sepsis secondary to suspected intra-abdominal infection, possible acute cholangitis/possible SBP, POA Likely due to SBP, continue empiric antibiotics for a few days per family wishes, goal of care is now comfort.   Chronic hepatitis C, hepatocellular carcinoma Used treatment several years ago, was previously seen by Dr. Lanny.  Now comfort measures.   Large ascites secondary to the above Ultrasound-guided paracentesis failed, supportive care.   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  Daughter and wife bedside 02/13/2024, daughter bedside 02/14/2024, 02/15/2024, 02/16/2024  Code Status : DNR  Consults  : Case discussed with GI Dr. Albertus over the phone on 02/12/2024, palliative care on board  PUD Prophylaxis :    Procedures  :     CT Head - Non acute  MRCP -  1. Interval progression of large right hepatic lobe mass with extension into the left hepatic lobe, compatible with infiltrative malignancy. Finding consistent with hepatocellular carcinoma . 2. 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 Complete occlusion of portal system. 3. Large-volume ascites. 4. No intrahepatic or extrahepatic biliary ductal dilatation.      Disposition Plan  :    Status is: Inpatient  DVT Prophylaxis  :    SCDs Start: 02/11/24 0211    Lab Results  Component Value Date   PLT 140 (L) 02/12/2024    Diet :  Diet Order             DIET SOFT Fluid consistency: Thin  Diet effective now                    Inpatient Medications  Scheduled Meds: Continuous Infusions:  cefTRIAXone (ROCEPHIN)  IV 2 g (02/15/24 0132)  metronidazole  500 mg (02/15/24 1147)   PRN Meds:.glycopyrrolate, HYDROmorphone  (DILAUDID ) injection, LORazepam , oxyCODONE , polyethylene glycol, prochlorperazine  Antibiotics  :    Anti-infectives (From admission, onward)    Start     Dose/Rate Route Frequency Ordered Stop   02/11/24 0230  cefTRIAXone (ROCEPHIN) 2 g in sodium chloride  0.9 % 100 mL IVPB        2 g 200 mL/hr over 30 Minutes Intravenous Every 24 hours 02/11/24 0226     02/11/24 0230  metroNIDAZOLE (FLAGYL) IVPB 500 mg        500 mg 100 mL/hr over 60 Minutes Intravenous Every 12 hours 02/11/24 0226           Objective:   Vitals:   02/14/24 1949 02/15/24 0001 02/15/24 0829 02/15/24 1908  BP: 122/64  121/63 109/62  Pulse: (!) 101  (!) 110 99  Resp: 10   (!) 9  Temp: (!) 100.6 F (38.1 C) 99.8 F (37.7 C) 99.7 F (37.6 C) 99.8 F (37.7 C)  TempSrc: Axillary Axillary Axillary Axillary  SpO2:      Weight:      Height:        Wt Readings from Last 3 Encounters:  02/11/24 45.4 kg  10/05/21 62.3 kg  09/23/21 64.9 kg     Intake/Output Summary (Last 24 hours) at 02/16/2024 0804 Last data filed at 02/16/2024 0300 Gross per 24 hour  Intake 730.71 ml  Output --  Net 730.71 ml     Physical Exam  In bed somnolent, minimally responsive but in no distress, positive scleral icterus, mild to moderate ascites, extremely weak and cachectic. No new F.N deficits,  [place].AT, Supple Neck, No JVD,   Symmetrical Chest wall movement, Good air movement bilaterally, CTAB RRR,No Gallops,Rubs or new Murmurs,  +ve B.Sounds, No tenderness,   No Cyanosis, Clubbing or edema       Data Review:    Recent Labs  Lab 02/10/24 2221 02/11/24 0600 02/12/24 0615  WBC 15.5* 15.0* 15.5*  HGB 8.5* 7.4* 6.7*  HCT 24.2* 21.9* 19.0*  PLT 149* 134* 140*  MCV 93.8 99.5 96.4  MCH 32.9 33.6 34.0  MCHC 35.1 33.8 35.3  RDW 18.9* 19.9* 20.0*  LYMPHSABS  --  2.3  --   MONOABS  --  1.3*  --   EOSABS  --  0.0  --   BASOSABS  --  0.0  --      Recent Labs  Lab 02/10/24 2221 02/10/24 2235 02/10/24 2316 02/11/24 0135 02/11/24 0600 02/12/24 0615  NA 127*  --   --   --  129* 130*  K 4.7  --   --   --  4.4 4.0  CL 97*  --   --   --  100 100  CO2 16*  --   --   --  15* 17*  ANIONGAP 14  --   --   --  14 13  GLUCOSE 102*  --   --   --  115* 95  BUN 39*  --   --   --  40* 48*  CREATININE 1.82*  --   --   --  1.73* 1.68*  AST 842*  --   --   --  836* 763*  ALT 161*  --   --   --  149* 135*  ALKPHOS 233*  --   --   --  224* 212*  BILITOT 7.4*  --   --   --  7.7* 7.4*  ALBUMIN 1.5*  --   --   --  2.0* 2.0*  CRP  --   --   --   --   --  8.2*  PROCALCITON  --   --   --   --   --  1.74  LATICACIDVEN  --  5.6*  --  4.1* 4.0*  --   INR  --   --  1.9*  --   --  2.1*  AMMONIA  --   --  24  --   --  28  BNP  --   --   --   --  110.8*  --   MG  --   --   --   --  2.0 1.9  PHOS  --   --   --   --  4.1 3.7  CALCIUM 8.1*  --   --   --  8.1* 8.1*      Recent Labs  Lab 02/10/24 2221 02/10/24 2235 02/10/24 2316 02/11/24 0135 02/11/24 0600 02/12/24 0615  CRP  --   --   --   --   --  8.2*  PROCALCITON  --   --   --   --   --  1.74  LATICACIDVEN  --  5.6*  --  4.1* 4.0*  --   INR  --   --  1.9*  --   --  2.1*  AMMONIA  --   --  24  --   --  28  BNP  --   --   --   --  110.8*  --   MG  --   --   --   --  2.0 1.9  CALCIUM 8.1*  --   --   --  8.1* 8.1*    --------------------------------------------------------------------------------------------------------------- No results found for: CHOL, HDL, LDLCALC, LDLDIRECT, TRIG, CHOLHDL  No results found for: HGBA1C No results for input(s): TSH, T4TOTAL, FREET4, T3FREE, THYROIDAB in the last 72 hours. No results for input(s): VITAMINB12, FOLATE, FERRITIN, TIBC, IRON, RETICCTPCT in the last 72 hours. ------------------------------------------------------------------------------------------------------------------ Cardiac Enzymes No results for  input(s): CKMB, TROPONINI, MYOGLOBIN in the last 168 hours.  Invalid input(s): CK  Micro Results Recent Results (from the past 240 hours)  Culture, blood (routine x 2)     Status: None   Collection Time: 02/10/24 11:16 PM   Specimen: BLOOD RIGHT ARM  Result Value Ref Range Status   Specimen Description BLOOD RIGHT ARM  Final   Special Requests   Final    BOTTLES DRAWN AEROBIC AND ANAEROBIC Blood Culture adequate volume   Culture   Final    NO GROWTH 5 DAYS Performed at [facility], [address] [place], [place] [address]    Report Status 02/15/2024 FINAL  Final  Culture, blood (routine x 2)     Status: None   Collection Time: 02/10/24 11:18 PM   Specimen: BLOOD LEFT HAND  Result Value Ref Range Status   Specimen Description BLOOD LEFT HAND  Final   Special Requests   Final    BOTTLES DRAWN AEROBIC ONLY Blood Culture adequate volume   Culture   Final    NO GROWTH 5 DAYS Performed at [facility], [address] [place], [place] [address]    Report Status 02/15/2024 FINAL  Final  MRSA Next Gen by PCR, Nasal     Status: None   Collection Time: 02/12/24  5:22 AM  Specimen: Nasal Mucosa; Nasal Swab  Result Value Ref Range Status   MRSA by PCR Next Gen NOT DETECTED NOT DETECTED Final    Comment: (NOTE) The GeneXpert MRSA Assay (FDA approved for NASAL specimens only), is one component of a comprehensive MRSA colonization surveillance program. It is not intended to diagnose MRSA infection nor to guide or monitor treatment for MRSA infections. Test performance is not FDA approved in patients less than 63 years old. Performed at [facility], [address], [place], [place] [address]     Radiology Report No results found.    Signature  -   Lavada Stank M.D on 02/16/2024 at 8:04 AM   -  To page go to [email or web address]

## 2024-02-16 NOTE — Plan of Care (Signed)
  Problem: Clinical Measurements: Goal: Diagnostic test results will improve Outcome: Not Progressing Goal: Respiratory complications will improve Outcome: Not Progressing Goal: Cardiovascular complication will be avoided Outcome: Not Progressing   Problem: Activity: Goal: Risk for activity intolerance will decrease Outcome: Not Progressing   Problem: Nutrition: Goal: Adequate nutrition will be maintained Outcome: Not Progressing

## 2024-02-16 NOTE — Plan of Care (Signed)

## 2024-03-10 NOTE — Progress Notes (Signed)
 Pt transported to morgue at this time.  Security notified. Glade Lee BSN RN CMSRN 03/09/2024, 5:07 AM

## 2024-03-10 NOTE — Progress Notes (Signed)
 Pt wife and daughter have left and they took all belongings with them.  They did choose a funeral home - see post mortem flowsheet.  Patient placement notified. Glade Lee BSN RN CMSRN March 16, 2024, 4:58 AM

## 2024-03-10 NOTE — Death Summary Note (Signed)
 [facility] Death Note                                                                                                                                                                                               John Padilla, is a 78 y.o. male, DOB - 03-Oct-1945, FMW:[id number]  Admit date - 02-23-24   Admitting Physician Terry LOISE Hurst, DO  Outpatient Primary MD for the patient is Pcp, No  LOS - 6  Chief Complaint  Patient presents with   Fatigue       Notification: Pcp, No notified of death of 03/01/24   Admit Date:  23-Feb-2024  Date of Death: Date of Death: 01-Mar-2024  Time of Death: Time of Death: 0230  Length of Stay: 6    Pronounced by - RN  History of present illness:   John Padilla is a 78 y.o. male with a history of -  chronic hepatitis C, known history of hepatocellular carcinoma, refused treatment several years ago.  Now severe extension of hepatocellular carcinoma, now extending into the portal veins, patient did not intend to get any treatment few years ago and now presented in extremely guarded condition with acute metabolic encephalopathy, he was seen by [facility], transitioned to full comfort care, passed away in  no distress as expected.   Final Diagnoses:  Cause of death -  Hepatocellular Cancer  Signature  -    Lavada Stank M.D on 01-Mar-2024 at 5:24 AM   -  To page go to [email or web address]   Total clinical and documentation time for today Under 30 minutes   Last Note                                                                      PROGRESS NOTE  Patient Demographics:    John Padilla, is a 78 y.o. male, DOB - 10-08-1945, FMW:[id number]  Outpatient Primary MD for the  patient is Pcp, No    LOS - 6  Admit date - 02/10/2024    Chief Complaint  Patient presents with   Fatigue       Brief Narrative (HPI from H&P)   78 y.o. male with medical history significant for chronic hepatitis C, hepatocellular carcinoma, vitiligo, anemia of chronic disease, ambulatory dysfunction, uses a walker at baseline, who presents to the ER from home due to lethargy, altered mental status, and generalized weakness.  The patient was diagnosed with hepatocellular carcinoma 2 years ago and opted out of treatment.  He lives alone.  Has not seen a medical provider since his diagnosis 2 years ago.  A few days ago, he was noted to have a significant decline in his functional status.  Associated with abdominal distention, poor oral intake, and bilateral lower extremity edema, left greater than right.  His daughter brought him to her home on Wednesday 02/06/24.  However, he has not been eating or drinking adequately.  Today, he only took 2 bites of his meal.   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   CT chest abdomen pelvis with contrast revealed large, markedly complex right hepatic mass with multiple cystic areas, measuring 12.7 x 9.7 cm.  This could reflect abscess or malignancy.  Marked dilation of the intrahepatic and extrahepatic bile ducts with a common bile duct measuring up to 15 mm.  Moderate to large volume ascites throughout the abdomen and pelvis.  Recommend further evaluation of the above findings with MRI abdomen with contrast and MRCP.    Subjective:    John Padilla today in bed, in no distress but more somnolent this morning.   Assessment  & Plan :   Underlying history of chronic hepatitis C, known history of hepatocellular  carcinoma, refused treatment several years ago.  Now severe extension of hepatocellular carcinoma, now extending into the portal veins, patient did not intend to get any treatment few years ago now in extremely guarded condition.  Presenting with acute metabolic encephalopathy, due to possible SBP however   Prognosis extremely poor, he has advanced known hepatocellular carcinoma for which he did not want treatment 2 years ago, case discussed with GI physician Dr. Albertus on 02/12/2024, patient will be best served with comfort measures, had extensive discussion with patient's daughter and son-in-law who now agree with it.  Patient is DNR, full care will be directed on keeping him comfortable, they want antibiotics to be continued for a few days for clinical diagnosis of SBP which I will continue.  Palliative care will be involved, all care will be directed towards keeping him comfortable.  He is now on full comfort care.  Expect him to pass away in the next few days.   Sepsis secondary to suspected intra-abdominal infection, possible acute cholangitis/possible SBP, POA Likely due to SBP, continue empiric antibiotics for a few days per family wishes, goal of care is now comfort.   Chronic hepatitis C, hepatocellular carcinoma Used treatment several years ago, was previously seen by Dr. Lanny.  Now comfort measures.   Large ascites secondary to the above Ultrasound-guided paracentesis failed, supportive care.   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  Daughter and wife bedside 02/13/2024, daughter bedside 02/14/2024, 02/15/2024, 02/16/2024  Code Status : DNR  Consults  : Case discussed with GI Dr. Albertus over the phone on 02/12/2024, palliative care on board  PUD Prophylaxis :    Procedures  :     CT Head - Non acute  MRCP -  1. Interval progression of large right hepatic lobe mass with extension into the left hepatic lobe, compatible with infiltrative malignancy. Finding consistent with hepatocellular carcinoma . 2. 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 Complete occlusion of portal system. 3. Large-volume ascites. 4. No intrahepatic or extrahepatic biliary ductal dilatation.      Disposition Plan  :    Status is: Inpatient  DVT Prophylaxis  :        Lab Results  Component Value Date   PLT 140 (L) 02/12/2024    Diet :  Diet Order             DIET SOFT Fluid consistency: Thin  Diet effective now                    Inpatient Medications  Scheduled Meds: Continuous Infusions:   PRN Meds:.bacitracin, glycopyrrolate, HYDROmorphone  (DILAUDID ) injection, LORazepam , oxyCODONE , polyethylene glycol, prochlorperazine  Antibiotics  :    Anti-infectives (From admission, onward)    Start     Dose/Rate Route Frequency Ordered Stop   02/11/24 0230  cefTRIAXone (ROCEPHIN) 2 g in sodium chloride  0.9 % 100 mL IVPB  Status:  Discontinued        2 g 200 mL/hr over 30 Minutes Intravenous Every 24 hours 02/11/24 0226 02/16/24 2126   02/11/24 0230  metroNIDAZOLE  (FLAGYL) IVPB 500 mg  Status:  Discontinued        500 mg 100 mL/hr over 60 Minutes Intravenous Every 12 hours 02/11/24 0226 02/16/24 2126         Objective:   Vitals:   02/15/24 0829 02/15/24 1908 02/16/24 2144 03-08-24 0030  BP: 121/63 109/62 (!) 95/54   Pulse: (!) 110 99 95   Resp:  (!) 9 (!) 24 16  Temp: 99.7 F (37.6 C) 99.8 F (37.7 C) 99.1 F (37.3 C)   TempSrc: Axillary Axillary Axillary   SpO2:      Weight:      Height:        Wt Readings from Last 3 Encounters:  02/11/24 45.4 kg  10/05/21 62.3 kg  09/23/21 64.9 kg    No intake or output data in the 24 hours ending 03/08/2024 0525    Physical Exam  In bed somnolent, minimally responsive but in no distress, positive scleral  icterus, mild to moderate ascites, extremely weak and cachectic. No new F.N deficits,  [place].AT, Supple Neck, No JVD,   Symmetrical Chest wall movement, Good air movement bilaterally, CTAB RRR,No Gallops,Rubs or new Murmurs,  +ve B.Sounds, No tenderness,   No Cyanosis, Clubbing or edema       Data Review:    Recent Labs  Lab 02/10/24 2221 02/11/24 0600 02/12/24 0615  WBC 15.5* 15.0* 15.5*  HGB 8.5* 7.4* 6.7*  HCT 24.2* 21.9* 19.0*  PLT 149* 134* 140*  MCV 93.8 99.5 96.4  MCH 32.9 33.6 34.0  MCHC 35.1 33.8 35.3  RDW 18.9* 19.9* 20.0*  LYMPHSABS  --  2.3  --   MONOABS  --  1.3*  --   EOSABS  --  0.0  --   BASOSABS  --  0.0  --     Recent Labs  Lab 02/10/24 2221 02/10/24 2235 02/10/24 2316 02/11/24 0135 02/11/24 0600 02/12/24 0615  NA 127*  --   --   --  129* 130*  K 4.7  --   --   --  4.4 4.0  CL 97*  --   --   --  100 100  CO2 16*  --   --   --  15* 17*  ANIONGAP 14  --   --   --  14 13  GLUCOSE 102*  --   --   --  115* 95  BUN 39*  --   --   --  40* 48*  CREATININE 1.82*  --   --   --  1.73* 1.68*  AST 842*  --   --   --  836* 763*  ALT 161*  --   --   --  149* 135*  ALKPHOS 233*  --   --   --  224* 212*  BILITOT 7.4*  --   --   --  7.7* 7.4*  ALBUMIN 1.5*   --   --   --  2.0* 2.0*  CRP  --   --   --   --   --  8.2*  PROCALCITON  --   --   --   --   --  1.74  LATICACIDVEN  --  5.6*  --  4.1* 4.0*  --   INR  --   --  1.9*  --   --  2.1*  AMMONIA  --   --  24  --   --  28  BNP  --   --   --   --  110.8*  --   MG  --   --   --   --  2.0 1.9  PHOS  --   --   --   --  4.1 3.7  CALCIUM 8.1*  --   --   --  8.1* 8.1*      Recent Labs  Lab 02/10/24 2221 02/10/24 2235 02/10/24 2316 02/11/24 0135 02/11/24 0600 02/12/24 0615  CRP  --   --   --   --   --  8.2*  PROCALCITON  --   --   --   --   --  1.74  LATICACIDVEN  --  5.6*  --  4.1* 4.0*  --   INR  --   --  1.9*  --   --  2.1*  AMMONIA  --   --  24  --   --  28  BNP  --   --   --   --  110.8*  --   MG  --   --   --   --  2.0 1.9  CALCIUM 8.1*  --   --   --  8.1* 8.1*    --------------------------------------------------------------------------------------------------------------- No results found for: CHOL, HDL, LDLCALC, LDLDIRECT, TRIG, CHOLHDL  No results found for: HGBA1C No results for input(s): TSH, T4TOTAL, FREET4, T3FREE, THYROIDAB in the last 72 hours. No results for input(s): VITAMINB12, FOLATE, FERRITIN, TIBC, IRON, RETICCTPCT in the last 72 hours. ------------------------------------------------------------------------------------------------------------------ Cardiac Enzymes No results for input(s): CKMB, TROPONINI, MYOGLOBIN in the last 168 hours.  Invalid input(s): CK  Micro Results Recent Results (from the past 240 hours)  Culture, blood (routine x 2)     Status: None   Collection Time: 02/10/24 11:16 PM   Specimen: BLOOD RIGHT ARM  Result Value Ref Range Status   Specimen Description BLOOD RIGHT ARM  Final   Special Requests   Final    BOTTLES DRAWN AEROBIC AND ANAEROBIC Blood Culture adequate volume   Culture   Final    NO GROWTH 5 DAYS Performed at [facility], [address], [place], [place] [address]     Report Status 02/15/2024 FINAL  Final  Culture, blood (routine x 2)     Status: None   Collection Time: 02/10/24 11:18 PM   Specimen: BLOOD LEFT HAND  Result Value Ref Range Status   Specimen Description BLOOD LEFT HAND  Final   Special Requests   Final    BOTTLES DRAWN AEROBIC ONLY Blood Culture adequate volume   Culture   Final    NO GROWTH 5 DAYS Performed at [facility], [address], [place], [place] [address]    Report Status 02/15/2024 FINAL  Final  MRSA Next Gen by PCR, Nasal     Status: None   Collection Time: 02/12/24  5:22 AM   Specimen: Nasal Mucosa; Nasal Swab  Result Value Ref Range Status   MRSA by PCR Next Gen NOT DETECTED NOT DETECTED Final    Comment: (NOTE) The GeneXpert MRSA Assay (FDA approved for NASAL specimens only), is one component of a comprehensive MRSA colonization surveillance program. It is not intended to diagnose MRSA infection nor to guide or monitor treatment for MRSA infections. Test performance is not FDA approved in patients less than 19 years old. Performed at [facility], [address] [place], [place] [address]     Radiology Report No results found.    Signature  -   Lavada Stank M.D on 02-25-24 at 5:25 AM   -  To page go to [email or web address]

## 2024-03-10 NOTE — Progress Notes (Signed)
 Patient was found without vital signs on 2024-02-26 at 02:30.  On exam he had dilated fixed pupils with no carotid pulses and no heart sounds on auscultation.  The patient was pronounced dead at 02:30 a.m. on 02/26/24. He is DNR and DNI.  His body will be released due to funeral home of the family's choice per my discussion with family.  Death summary will be performed and death certificate will be prepared by Dr. Lavada Stank, MD, the patient's attending physician.SABRA

## 2024-03-10 NOTE — Progress Notes (Signed)
 Called to pt's room by his daughter at 62.  No respirations or HR auscultated at that time by myself and Amy Animal Nutritionist.  Dr. Lawence, who is covering, notified and order for RN to pronounce obtained.  Pt's daughter remains at bedside and stated her husband was bringing pt's wife to see pt.  She also indicated they had not made any decision about funeral home and requested more time.    Glade Lee BSN RN CMSRN 02/23/24, 3:14 AM

## 2024-03-10 DEATH — deceased
